# Patient Record
Sex: Male | Born: 1950 | Race: White | Hispanic: No | Marital: Married | Smoking: Former smoker
Health system: Southern US, Community
[De-identification: ages and names within clinical notes are randomized; demographics above are authoritative.]

## PROBLEM LIST (undated history)

## (undated) DIAGNOSIS — Z8719 Personal history of other diseases of the digestive system: Secondary | ICD-10-CM

## (undated) DIAGNOSIS — E785 Hyperlipidemia, unspecified: Secondary | ICD-10-CM

## (undated) DIAGNOSIS — K219 Gastro-esophageal reflux disease without esophagitis: Secondary | ICD-10-CM

## (undated) DIAGNOSIS — D1803 Hemangioma of intra-abdominal structures: Secondary | ICD-10-CM

## (undated) DIAGNOSIS — N200 Calculus of kidney: Secondary | ICD-10-CM

## (undated) DIAGNOSIS — N189 Chronic kidney disease, unspecified: Secondary | ICD-10-CM

## (undated) DIAGNOSIS — K2270 Barrett's esophagus without dysplasia: Secondary | ICD-10-CM

## (undated) HISTORY — PX: KNEE ARTHROSCOPY: SUR90

## (undated) HISTORY — PX: ESOPHAGOGASTRODUODENOSCOPY: SHX1529

## (undated) HISTORY — PX: COLONOSCOPY: SHX174

## (undated) HISTORY — PX: FRACTURE SURGERY: SHX138

## (undated) HISTORY — PX: NOSE SURGERY: SHX723

---

## 2004-08-01 ENCOUNTER — Ambulatory Visit: Payer: Self-pay | Admitting: Surgery

## 2004-08-10 ENCOUNTER — Emergency Department: Payer: Self-pay | Admitting: Emergency Medicine

## 2004-11-19 ENCOUNTER — Ambulatory Visit: Payer: Self-pay | Admitting: Urology

## 2004-12-26 ENCOUNTER — Emergency Department: Payer: Self-pay | Admitting: Internal Medicine

## 2004-12-26 ENCOUNTER — Other Ambulatory Visit: Payer: Self-pay

## 2007-10-17 ENCOUNTER — Ambulatory Visit: Payer: Self-pay | Admitting: Family Medicine

## 2007-10-20 ENCOUNTER — Ambulatory Visit: Payer: Self-pay | Admitting: Family Medicine

## 2008-01-09 ENCOUNTER — Ambulatory Visit: Payer: Self-pay | Admitting: Gastroenterology

## 2008-01-18 ENCOUNTER — Ambulatory Visit: Payer: Self-pay | Admitting: Family Medicine

## 2008-02-29 ENCOUNTER — Ambulatory Visit: Payer: Self-pay | Admitting: Gastroenterology

## 2008-04-12 ENCOUNTER — Ambulatory Visit: Payer: Self-pay | Admitting: Gastroenterology

## 2008-08-03 ENCOUNTER — Ambulatory Visit: Payer: Self-pay | Admitting: Urology

## 2009-02-05 ENCOUNTER — Ambulatory Visit: Payer: Self-pay | Admitting: Urology

## 2009-03-02 ENCOUNTER — Ambulatory Visit: Payer: Self-pay | Admitting: Family Medicine

## 2009-07-05 ENCOUNTER — Ambulatory Visit: Payer: Self-pay | Admitting: Gastroenterology

## 2011-05-19 ENCOUNTER — Emergency Department: Payer: Self-pay | Admitting: Emergency Medicine

## 2011-05-19 LAB — BASIC METABOLIC PANEL
BUN: 10 mg/dL (ref 7–18)
Chloride: 104 mmol/L (ref 98–107)
Co2: 29 mmol/L (ref 21–32)
Creatinine: 1.24 mg/dL (ref 0.60–1.30)
EGFR (Non-African Amer.): >60
Glucose: 105 mg/dL — ABNORMAL HIGH (ref 65–99)
Osmolality: 281 (ref 275–301)

## 2011-05-19 LAB — URINALYSIS, COMPLETE
Bilirubin,UR: NEGATIVE
Glucose,UR: NEGATIVE mg/dL (ref 0–75)
Ketone: NEGATIVE
Leukocyte Esterase: NEGATIVE
Ph: 6 (ref 4.5–8.0)
Protein: NEGATIVE
Specific Gravity: 1.009 (ref 1.003–1.030)
WBC UR: 1 /HPF (ref 0–5)

## 2011-05-19 LAB — CBC
HCT: 42 % (ref 40.0–52.0)
MCH: 31.4 pg (ref 26.0–34.0)
MCV: 93 fL (ref 80–100)
Platelet: 219 10*3/uL (ref 150–440)
RBC: 4.52 10*6/uL (ref 4.40–5.90)
RDW: 13.4 % (ref 11.5–14.5)

## 2011-08-24 ENCOUNTER — Ambulatory Visit: Payer: Self-pay | Admitting: Gastroenterology

## 2011-08-25 LAB — PATHOLOGY REPORT

## 2011-10-01 ENCOUNTER — Ambulatory Visit: Payer: Self-pay | Admitting: Gastroenterology

## 2012-12-19 ENCOUNTER — Ambulatory Visit: Payer: Self-pay | Admitting: Gastroenterology

## 2013-01-09 ENCOUNTER — Ambulatory Visit: Payer: Self-pay | Admitting: Gastroenterology

## 2013-01-10 LAB — PATHOLOGY REPORT

## 2014-07-18 ENCOUNTER — Other Ambulatory Visit: Payer: Self-pay | Admitting: Orthopedic Surgery

## 2014-07-18 DIAGNOSIS — M2392 Unspecified internal derangement of left knee: Secondary | ICD-10-CM

## 2014-07-26 ENCOUNTER — Ambulatory Visit
Admission: RE | Admit: 2014-07-26 | Discharge: 2014-07-26 | Disposition: A | Discharge status: Home / Self Care | Payer: Commercial Managed Care - HMO | Source: Ambulatory Visit | Attending: Orthopedic Surgery | Admitting: Orthopedic Surgery

## 2014-07-26 DIAGNOSIS — S83242A Other tear of medial meniscus, current injury, left knee, initial encounter: Secondary | ICD-10-CM | POA: Diagnosis not present

## 2014-07-26 DIAGNOSIS — M2392 Unspecified internal derangement of left knee: Secondary | ICD-10-CM

## 2014-07-26 DIAGNOSIS — S83412A Sprain of medial collateral ligament of left knee, initial encounter: Secondary | ICD-10-CM | POA: Insufficient documentation

## 2014-07-26 DIAGNOSIS — M25561 Pain in right knee: Secondary | ICD-10-CM | POA: Diagnosis present

## 2014-07-26 DIAGNOSIS — M1712 Unilateral primary osteoarthritis, left knee: Secondary | ICD-10-CM | POA: Diagnosis not present

## 2014-08-03 ENCOUNTER — Other Ambulatory Visit: Payer: 59

## 2014-08-03 ENCOUNTER — Encounter: Payer: Self-pay | Admitting: *Deleted

## 2014-08-03 DIAGNOSIS — Z8249 Family history of ischemic heart disease and other diseases of the circulatory system: Secondary | ICD-10-CM | POA: Diagnosis not present

## 2014-08-03 DIAGNOSIS — Z806 Family history of leukemia: Secondary | ICD-10-CM | POA: Diagnosis not present

## 2014-08-03 DIAGNOSIS — M6752 Plica syndrome, left knee: Secondary | ICD-10-CM | POA: Diagnosis present

## 2014-08-03 DIAGNOSIS — Z803 Family history of malignant neoplasm of breast: Secondary | ICD-10-CM | POA: Diagnosis not present

## 2014-08-03 DIAGNOSIS — Z79899 Other long term (current) drug therapy: Secondary | ICD-10-CM | POA: Diagnosis not present

## 2014-08-03 DIAGNOSIS — Z833 Family history of diabetes mellitus: Secondary | ICD-10-CM | POA: Diagnosis not present

## 2014-08-03 DIAGNOSIS — E785 Hyperlipidemia, unspecified: Secondary | ICD-10-CM | POA: Diagnosis not present

## 2014-08-03 DIAGNOSIS — K2270 Barrett's esophagus without dysplasia: Secondary | ICD-10-CM | POA: Diagnosis not present

## 2014-08-03 DIAGNOSIS — D1809 Hemangioma of other sites: Secondary | ICD-10-CM | POA: Diagnosis not present

## 2014-08-03 DIAGNOSIS — K219 Gastro-esophageal reflux disease without esophagitis: Secondary | ICD-10-CM | POA: Diagnosis not present

## 2014-08-03 DIAGNOSIS — M23222 Derangement of posterior horn of medial meniscus due to old tear or injury, left knee: Secondary | ICD-10-CM | POA: Diagnosis present

## 2014-08-03 DIAGNOSIS — Z87891 Personal history of nicotine dependence: Secondary | ICD-10-CM | POA: Diagnosis not present

## 2014-08-03 NOTE — Patient Instructions (Signed)
  Your procedure is scheduled on: 08-07-14 Report to [place]. To find out your arrival time please call [phone] between 1PM - 3PM on 08-06-14  Remember: Instructions that are not followed completely may result in serious medical risk, up to and including death, or upon the discretion of your surgeon and anesthesiologist your surgery may need to be rescheduled.    _X___ 1. Do not eat food or drink liquids after midnight. No gum chewing or hard candies.     _X___ 2. No Alcohol for 24 hours before or after surgery.   ____ 3. Bring all medications with you on the day of surgery if instructed.    ____ 4. Notify your doctor if there is any change in your medical condition     (cold, fever, infections).     Do not wear jewelry, make-up, hairpins, clips or nail polish.  Do not wear lotions, powders, or perfumes. You may wear deodorant.  Do not shave 48 hours prior to surgery. Men may shave face and neck.  Do not bring valuables to the hospital.    [facility] is not responsible for any belongings or valuables.               Contacts, dentures or bridgework may not be worn into surgery.  Leave your suitcase in the car. After surgery it may be brought to your room.  For patients admitted to the hospital, discharge time is determined by your  treatment team.   Patients discharged the day of surgery will not be allowed to drive home.   Please read over the following fact sheets that you were given:     _X___ Take these medicines the morning of surgery with A SIP OF WATER:    1. PROTONIX  2. TAKE AN EXTRA PROTONIX ON Monday NIGHT  3.   4.  5.  6.  ____ Fleet Enema (as directed)   ____ Use CHG Soap as directed  ____ Use inhalers on the day of surgery  ____ Stop metformin 2 days prior to surgery    ____ Take 1/2 of usual insulin dose the night before surgery and none on the morning of surgery.   ____ Stop Coumadin/Plavix/aspirin-N/A  ____ Stop  Anti-inflammatories-NO NSAIDS OR ASA PRODUCTS-TYLENOL OK   _X___ Stop supplements until after surgery-STOP FISH OIL NOW  ____ Bring C-Pap to the hospital.

## 2014-08-07 ENCOUNTER — Ambulatory Visit: Payer: Commercial Managed Care - HMO | Admitting: Certified Registered"

## 2014-08-07 ENCOUNTER — Ambulatory Visit
Admission: RE | Admit: 2014-08-07 | Discharge: 2014-08-07 | Disposition: A | Discharge status: Home / Self Care | Payer: Commercial Managed Care - HMO | Source: Ambulatory Visit | Attending: Orthopedic Surgery | Admitting: Orthopedic Surgery

## 2014-08-07 ENCOUNTER — Encounter
Admission: RE | Disposition: A | Discharge status: Home / Self Care | Payer: Self-pay | Source: Ambulatory Visit | Attending: Orthopedic Surgery

## 2014-08-07 ENCOUNTER — Encounter: Payer: Self-pay | Admitting: *Deleted

## 2014-08-07 DIAGNOSIS — Z833 Family history of diabetes mellitus: Secondary | ICD-10-CM | POA: Insufficient documentation

## 2014-08-07 DIAGNOSIS — D1809 Hemangioma of other sites: Secondary | ICD-10-CM | POA: Insufficient documentation

## 2014-08-07 DIAGNOSIS — Z803 Family history of malignant neoplasm of breast: Secondary | ICD-10-CM | POA: Insufficient documentation

## 2014-08-07 DIAGNOSIS — Z8249 Family history of ischemic heart disease and other diseases of the circulatory system: Secondary | ICD-10-CM | POA: Insufficient documentation

## 2014-08-07 DIAGNOSIS — E785 Hyperlipidemia, unspecified: Secondary | ICD-10-CM | POA: Insufficient documentation

## 2014-08-07 DIAGNOSIS — Z806 Family history of leukemia: Secondary | ICD-10-CM | POA: Insufficient documentation

## 2014-08-07 DIAGNOSIS — Z79899 Other long term (current) drug therapy: Secondary | ICD-10-CM | POA: Insufficient documentation

## 2014-08-07 DIAGNOSIS — K2270 Barrett's esophagus without dysplasia: Secondary | ICD-10-CM | POA: Insufficient documentation

## 2014-08-07 DIAGNOSIS — Z87891 Personal history of nicotine dependence: Secondary | ICD-10-CM | POA: Diagnosis not present

## 2014-08-07 DIAGNOSIS — K219 Gastro-esophageal reflux disease without esophagitis: Secondary | ICD-10-CM | POA: Insufficient documentation

## 2014-08-07 HISTORY — PX: KNEE ARTHROSCOPY WITH MEDIAL MENISECTOMY: SHX5651

## 2014-08-07 HISTORY — DX: Chronic kidney disease, unspecified: N18.9

## 2014-08-07 HISTORY — DX: Hemangioma of intra-abdominal structures: D18.03

## 2014-08-07 HISTORY — DX: Hyperlipidemia, unspecified: E78.5

## 2014-08-07 HISTORY — DX: Barrett's esophagus without dysplasia: K22.70

## 2014-08-07 HISTORY — DX: Gastro-esophageal reflux disease without esophagitis: K21.9

## 2014-08-07 HISTORY — PX: KNEE ARTHROSCOPY: SUR90

## 2014-08-07 SURGERY — ARTHROSCOPY, KNEE, WITH MEDIAL MENISCECTOMY
Anesthesia: General | Site: Knee | Laterality: Left | Wound class: Clean

## 2014-08-07 MED ORDER — HYDROCODONE-ACETAMINOPHEN 5-325 MG PO TABS: 1.0000 | ORAL_TABLET | Freq: Four times a day (QID) | ORAL | Status: DC | PRN

## 2014-08-07 MED ORDER — LIDOCAINE HCL (CARDIAC) 20 MG/ML IV SOLN
INTRAVENOUS | Status: DC | PRN
  Administered 2014-08-07: 50 mg via INTRAVENOUS

## 2014-08-07 MED ORDER — FENTANYL CITRATE (PF) 100 MCG/2ML IJ SOLN
INTRAMUSCULAR | Status: DC | PRN
  Administered 2014-08-07 (×2): 50 ug via INTRAVENOUS

## 2014-08-07 MED ORDER — MIDAZOLAM HCL 2 MG/2ML IJ SOLN
INTRAMUSCULAR | Status: DC | PRN
  Administered 2014-08-07: 2 mg via INTRAVENOUS

## 2014-08-07 MED ORDER — LACTATED RINGERS IV SOLN
INTRAVENOUS | Status: DC
  Administered 2014-08-07 (×2): via INTRAVENOUS

## 2014-08-07 MED ORDER — GLYCOPYRROLATE 0.2 MG/ML IJ SOLN
INTRAMUSCULAR | Status: DC | PRN
  Administered 2014-08-07: 0.2 mg via INTRAVENOUS

## 2014-08-07 MED ORDER — FENTANYL CITRATE (PF) 100 MCG/2ML IJ SOLN: 25.0000 ug | INTRAMUSCULAR | Status: DC | PRN

## 2014-08-07 MED ORDER — BUPIVACAINE-EPINEPHRINE (PF) 0.5% -1:200000 IJ SOLN
INTRAMUSCULAR | Status: AC
  Filled 2014-08-07: qty 30

## 2014-08-07 MED ORDER — BUPIVACAINE-EPINEPHRINE (PF) 0.5% -1:200000 IJ SOLN
INTRAMUSCULAR | Status: DC | PRN
  Administered 2014-08-07: 20 mL via PERINEURAL

## 2014-08-07 MED ORDER — ONDANSETRON HCL 4 MG/2ML IJ SOLN: 4.0000 mg | Freq: Once | INTRAMUSCULAR | Status: DC | PRN

## 2014-08-07 MED ORDER — ONDANSETRON HCL 4 MG/2ML IJ SOLN
INTRAMUSCULAR | Status: DC | PRN
  Administered 2014-08-07: 4 mg via INTRAVENOUS

## 2014-08-07 MED ORDER — KETOROLAC TROMETHAMINE 30 MG/ML IJ SOLN
INTRAMUSCULAR | Status: DC | PRN
  Administered 2014-08-07: 30 mg via INTRAVENOUS

## 2014-08-07 MED ORDER — PROPOFOL 10 MG/ML IV BOLUS
INTRAVENOUS | Status: DC | PRN
  Administered 2014-08-07: 150 mg via INTRAVENOUS

## 2014-08-07 SURGICAL SUPPLY — 27 items
BANDAGE ELASTIC 4 CLIP NS LF (GAUZE/BANDAGES/DRESSINGS) ×3 IMPLANT
BANDAGE ELASTIC 4 CLIP ST LF (GAUZE/BANDAGES/DRESSINGS) ×3 IMPLANT
BLADE FULL RADIUS 3.5 (BLADE) IMPLANT
BLADE INCISOR PLUS 4.5 (BLADE) IMPLANT
BLADE SHAVER 4.5 DBL SERAT CV (CUTTER) ×3 IMPLANT
BLADE SHAVER 4.5X7 STR FR (MISCELLANEOUS) ×3 IMPLANT
CHLORAPREP W/TINT 26ML (MISCELLANEOUS) ×3 IMPLANT
GAUZE PETRO XEROFOAM 1X8 (MISCELLANEOUS) ×3 IMPLANT
GAUZE SPONGE 4X4 12PLY STRL (GAUZE/BANDAGES/DRESSINGS) ×3 IMPLANT
GLOVE BIOGEL PI IND STRL 9 (GLOVE) ×1 IMPLANT
GLOVE BIOGEL PI INDICATOR 9 (GLOVE) ×2
GLOVE SURG ORTHO 9.0 STRL STRW (GLOVE) ×3 IMPLANT
GOWN SPECIALTY ULTRA XL (MISCELLANEOUS) ×3 IMPLANT
GOWN STRL REUS W/ TWL LRG LVL3 (GOWN DISPOSABLE) ×1 IMPLANT
GOWN STRL REUS W/TWL LRG LVL3 (GOWN DISPOSABLE) ×2
IV LACTATED RINGER IRRG 3000ML (IV SOLUTION) ×8
IV LR IRRIG 3000ML ARTHROMATIC (IV SOLUTION) ×4 IMPLANT
KIT RM TURNOVER STRD PROC AR (KITS) ×3 IMPLANT
MANIFOLD NEPTUNE II (INSTRUMENTS) ×3 IMPLANT
PACK ARTHROSCOPY KNEE (MISCELLANEOUS) ×3 IMPLANT
SET TUBE SUCT SHAVER OUTFL 24K (TUBING) ×3 IMPLANT
SET TUBE TIP INTRA-ARTICULAR (MISCELLANEOUS) ×3 IMPLANT
SUT ETHILON 4-0 (SUTURE) ×2
SUT ETHILON 4-0 FS2 18XMFL BLK (SUTURE) ×1
SUTURE ETHLN 4-0 FS2 18XMF BLK (SUTURE) ×1 IMPLANT
TUBING ARTHRO INFLOW-ONLY STRL (TUBING) ×3 IMPLANT
WAND HAND CNTRL MULTIVAC 50 (MISCELLANEOUS) ×3 IMPLANT

## 2014-08-07 NOTE — Progress Notes (Signed)
Elevated leg on pillows  Dr Rudene Christians to check pulse to left foot  Weak and marked with pen mark

## 2014-08-07 NOTE — Anesthesia Procedure Notes (Signed)
Procedure Name: LMA Insertion Date/Time: 08/07/2014 12:46 PM Performed by: Rolla Plate Pre-anesthesia Checklist: Patient identified, Patient being monitored, Timeout performed, Emergency Drugs available and Suction available Patient Re-evaluated:Patient Re-evaluated prior to inductionOxygen Delivery Method: Circle system utilized Preoxygenation: Pre-oxygenation with 100% oxygen Intubation Type: IV induction LMA: LMA inserted LMA Size: 4.0 Number of attempts: 1 Placement Confirmation: positive ETCO2 and breath sounds checked- equal and bilateral Tube secured with: Tape Dental Injury: Teeth and Oropharynx as per pre-operative assessment

## 2014-08-07 NOTE — Discharge Instructions (Signed)
Weightbearing as tolerated on left leg, one aspirin a day until walking normal. If bandage slides down leg coverage to incisions with Band-Aid and rewrap Ace wrap discarded remaining bandage

## 2014-08-07 NOTE — Anesthesia Postprocedure Evaluation (Signed)
  Anesthesia Post-op Note  Patient: Maurice Lucas  Procedure(s) Performed: Procedure(s) with comments: KNEE ARTHROSCOPY WITH MEDIAL MENISECTOMY (Left) - partial medial menisectomy, plica incsion  Anesthesia type:General  Patient location: PACU  Post pain: Pain level controlled  Post assessment: Post-op Vital signs reviewed, Patient's Cardiovascular Status Stable, Respiratory Function Stable, Patent Airway and No signs of Nausea or vomiting  Post vital signs: Reviewed and stable  Last Vitals:  Filed Vitals:   08/07/14 1449  BP: 118/78  Pulse:   Temp: 36.7 C  Resp: 16    Level of consciousness: awake, alert  and patient cooperative  Complications: No apparent anesthesia complications. Chest clear. Saturation 96-98. Will d/c.

## 2014-08-07 NOTE — Transfer of Care (Signed)
Immediate Anesthesia Transfer of Care Note  Patient: Maurice Lucas  Procedure(s) Performed: Procedure(s) with comments: KNEE ARTHROSCOPY WITH MEDIAL MENISECTOMY (Left) - partial medial menisectomy, plica incsion  Patient Location: PACU  Anesthesia Type:General  Level of Consciousness: sedated  Airway & Oxygen Therapy: Patient Spontanous Breathing and Patient connected to face mask oxygen  Post-op Assessment: Report given to RN  Post vital signs: Reviewed  Last Vitals:  Filed Vitals:   08/07/14 1347  BP: 128/89  Pulse: 66  Temp: 36.9 C  Resp: 19    Complications: No apparent anesthesia complications

## 2014-08-07 NOTE — H&P (Signed)
Reviewed paper H+P, will be scanned into chart. No changes noted.  

## 2014-08-07 NOTE — Op Note (Signed)
08/07/2014   1:43 PM  PATIENT:  Maurice Lucas  64 y.o. male  PRE-OPERATIVE DIAGNOSIS:  COMPLETE TEAR MEDIAL MENISCUS LEFT  POST-OPERATIVE DIAGNOSIS:  COMPLETE TEAR MEDIAL MENISCUS LEFT  PROCEDURE:  Procedure(s) with comments: KNEE ARTHROSCOPY WITH MEDIAL MENISECTOMY (Left) - partial medial menisectomy, plica incsion  SURGEON: Laurene Footman, MD  ASSISTANTS: none  ANESTHESIA:   spinal  EBL:  Total I/O In: 500 [I.V.:500] Out: -   BLOOD ADMINISTERED:none  DRAINS: none   LOCAL MEDICATIONS USED:  MARCAINE     SPECIMEN:  No Specimen  DISPOSITION OF SPECIMEN:  N/A  COUNTS:  YES  TOURNIQUET:    IMPLANTS: None  DICTATION: .Dragon Dictation patient was brought to the operating room and after general anesthesia was obtained the left leg was prepped and draped in usual sterile fashion. After appropriate patient education timeout procedure were completed, inferior lateral portal was made and the arthroscope was introduced initial inspection revealed moderate patellofemoral degenerative changes with fissuring of the articular cartilage of the patella and the central portion of the trochlea the patellas in the midline. There is a medial plica which was socially ablated with use of an ArthroCare wand.: Medial compartment inferior medial portal was made and on probing the anterior posterior third of the meniscus appeared to be torn in a complex pattern there was significant degenerative changes to the medial compartment as well. Without exposed bone the anterior cruciate ligament was intact and lateral compartment was normal. The meniscal punch was used to debride most of the meniscus tear followed by an ArthroCare wand to smooth the edges. The probing the meniscus was stable there is also a small tear of the anterior meniscus which was ablated with the ArthroCare wand this point the plica band was excised with the ArthroCare wand. The knee was irrigated until clear. Wound was infiltrated with  half percent Sensorcaine with epinephrine 20 cc Xeroform 4 x 4's web roll and Ace wrap applied  PLAN OF CARE: Discharge to home after PACU  PATIENT DISPOSITION:  PACU - hemodynamically stable.

## 2014-08-07 NOTE — Anesthesia Preprocedure Evaluation (Signed)
Anesthesia Evaluation  Patient identified by MRN, date of birth, ID band Patient awake    Reviewed: Allergy & Precautions, NPO status , Patient's Chart, lab work & pertinent test results  Airway Mallampati: II       Dental  (+) Upper Dentures, Lower Dentures   Pulmonary former smoker,          Cardiovascular     Neuro/Psych    GI/Hepatic   Endo/Other    Renal/GU Renal InsufficiencyRenal disease     Musculoskeletal   Abdominal   Peds  Hematology   Anesthesia Other Findings   Reproductive/Obstetrics                             Anesthesia Physical Anesthesia Plan  ASA: II  Anesthesia Plan: General   Post-op Pain Management:    Induction: Intravenous  Airway Management Planned: LMA  Additional Equipment:   Intra-op Plan:   Post-operative Plan:   Informed Consent: I have reviewed the patients History and Physical, chart, labs and discussed the procedure including the risks, benefits and alternatives for the proposed anesthesia with the patient or authorized representative who has indicated his/her understanding and acceptance.     Plan Discussed with: CRNA  Anesthesia Plan Comments:         Anesthesia Quick Evaluation

## 2014-10-04 ENCOUNTER — Other Ambulatory Visit: Payer: Self-pay | Admitting: Gastroenterology

## 2014-10-04 DIAGNOSIS — R1310 Dysphagia, unspecified: Secondary | ICD-10-CM

## 2014-10-08 ENCOUNTER — Ambulatory Visit
Admission: RE | Admit: 2014-10-08 | Discharge: 2014-10-08 | Disposition: A | Discharge status: Home / Self Care | Payer: 59 | Source: Ambulatory Visit | Attending: Gastroenterology | Admitting: Gastroenterology

## 2014-10-08 DIAGNOSIS — R1314 Dysphagia, pharyngoesophageal phase: Secondary | ICD-10-CM | POA: Diagnosis present

## 2014-10-08 DIAGNOSIS — R1310 Dysphagia, unspecified: Secondary | ICD-10-CM

## 2014-10-08 DIAGNOSIS — K222 Esophageal obstruction: Secondary | ICD-10-CM | POA: Insufficient documentation

## 2014-11-01 ENCOUNTER — Encounter: Payer: Self-pay | Admitting: *Deleted

## 2014-11-01 DIAGNOSIS — K222 Esophageal obstruction: Secondary | ICD-10-CM | POA: Diagnosis not present

## 2014-11-01 DIAGNOSIS — Z87891 Personal history of nicotine dependence: Secondary | ICD-10-CM | POA: Diagnosis not present

## 2014-11-01 DIAGNOSIS — Z87442 Personal history of urinary calculi: Secondary | ICD-10-CM | POA: Diagnosis not present

## 2014-11-01 DIAGNOSIS — K317 Polyp of stomach and duodenum: Secondary | ICD-10-CM | POA: Diagnosis not present

## 2014-11-01 DIAGNOSIS — N189 Chronic kidney disease, unspecified: Secondary | ICD-10-CM | POA: Diagnosis not present

## 2014-11-01 DIAGNOSIS — K2970 Gastritis, unspecified, without bleeding: Secondary | ICD-10-CM | POA: Diagnosis not present

## 2014-11-01 DIAGNOSIS — K219 Gastro-esophageal reflux disease without esophagitis: Secondary | ICD-10-CM | POA: Diagnosis not present

## 2014-11-01 DIAGNOSIS — E785 Hyperlipidemia, unspecified: Secondary | ICD-10-CM | POA: Diagnosis not present

## 2014-11-01 DIAGNOSIS — R1310 Dysphagia, unspecified: Secondary | ICD-10-CM | POA: Diagnosis present

## 2014-11-02 ENCOUNTER — Encounter: Payer: Self-pay | Admitting: Anesthesiology

## 2014-11-02 ENCOUNTER — Ambulatory Visit: Payer: Commercial Managed Care - HMO | Admitting: Anesthesiology

## 2014-11-02 ENCOUNTER — Ambulatory Visit
Admission: RE | Admit: 2014-11-02 | Discharge: 2014-11-02 | Disposition: A | Discharge status: Home / Self Care | Payer: Commercial Managed Care - HMO | Source: Ambulatory Visit | Attending: Gastroenterology | Admitting: Gastroenterology

## 2014-11-02 ENCOUNTER — Encounter
Admission: RE | Disposition: A | Discharge status: Home / Self Care | Payer: Self-pay | Source: Ambulatory Visit | Attending: Gastroenterology

## 2014-11-02 DIAGNOSIS — R1310 Dysphagia, unspecified: Secondary | ICD-10-CM | POA: Insufficient documentation

## 2014-11-02 DIAGNOSIS — Z87442 Personal history of urinary calculi: Secondary | ICD-10-CM | POA: Insufficient documentation

## 2014-11-02 DIAGNOSIS — K222 Esophageal obstruction: Secondary | ICD-10-CM | POA: Insufficient documentation

## 2014-11-02 DIAGNOSIS — K2970 Gastritis, unspecified, without bleeding: Secondary | ICD-10-CM | POA: Insufficient documentation

## 2014-11-02 DIAGNOSIS — K219 Gastro-esophageal reflux disease without esophagitis: Secondary | ICD-10-CM | POA: Insufficient documentation

## 2014-11-02 DIAGNOSIS — Z87891 Personal history of nicotine dependence: Secondary | ICD-10-CM | POA: Insufficient documentation

## 2014-11-02 DIAGNOSIS — N189 Chronic kidney disease, unspecified: Secondary | ICD-10-CM | POA: Insufficient documentation

## 2014-11-02 DIAGNOSIS — E785 Hyperlipidemia, unspecified: Secondary | ICD-10-CM | POA: Insufficient documentation

## 2014-11-02 DIAGNOSIS — K317 Polyp of stomach and duodenum: Secondary | ICD-10-CM | POA: Insufficient documentation

## 2014-11-02 HISTORY — PX: ESOPHAGOGASTRODUODENOSCOPY (EGD) WITH PROPOFOL: SHX5813

## 2014-11-02 HISTORY — DX: Calculus of kidney: N20.0

## 2014-11-02 HISTORY — DX: Personal history of other diseases of the digestive system: Z87.19

## 2014-11-02 SURGERY — ESOPHAGOGASTRODUODENOSCOPY (EGD) WITH PROPOFOL
Anesthesia: General

## 2014-11-02 MED ORDER — PROPOFOL 10 MG/ML IV BOLUS
INTRAVENOUS | Status: DC | PRN
  Administered 2014-11-02: 80 mg via INTRAVENOUS

## 2014-11-02 MED ORDER — PROPOFOL INFUSION 10 MG/ML OPTIME
INTRAVENOUS | Status: DC | PRN
  Administered 2014-11-02: 150 ug/kg/min via INTRAVENOUS

## 2014-11-02 MED ORDER — PHENYLEPHRINE HCL 10 MG/ML IJ SOLN
INTRAMUSCULAR | Status: DC | PRN
  Administered 2014-11-02 (×2): 100 ug via INTRAVENOUS

## 2014-11-02 MED ORDER — LIDOCAINE HCL (CARDIAC) 20 MG/ML IV SOLN
INTRAVENOUS | Status: DC | PRN
  Administered 2014-11-02: 100 mg via INTRAVENOUS

## 2014-11-02 MED ORDER — GLYCOPYRROLATE 0.2 MG/ML IJ SOLN
INTRAMUSCULAR | Status: DC | PRN
  Administered 2014-11-02: 0.2 mg via INTRAVENOUS

## 2014-11-02 MED ORDER — SODIUM CHLORIDE 0.9 % IV SOLN
INTRAVENOUS | Status: DC
  Administered 2014-11-02: 1000 mL via INTRAVENOUS

## 2014-11-02 NOTE — H&P (Signed)
Outpatient short stay form Pre-procedure 11/02/2014 8:13 AM Lollie Sails MD  Primary Physician: Dr. Thereasa Distance  Reason for visit:  Follow-up Barrett's esophagus and dysphagia  History of present illness:  Patient is a 64 year old male presenting for follow-up in regards to Barrett's esophagus. It also been experiencing some dysphagia that he related to taking aspirin. Since he stopped the aspirin is dysphagia better. He does take pantoprazole 40 mg daily. He has 2 difficulties with his swallowing 1 is in the cervical region as being probably more prominent also affected with water as well as some in the lower chest area. This does not seem to be as bad as it was.    Current facility-administered medications:  .  0.9 %  sodium chloride infusion, , Intravenous, Continuous, Lollie Sails, MD, Last Rate: 20 mL/hr at 11/02/14 0728, 1,000 mL at 11/02/14 6389  Facility-Administered Medications Ordered in Other Encounters:  .  glycopyrrolate (ROBINUL) injection, , , Anesthesia Intra-op, Andria Frames, MD, 0.2 mg at 11/02/14 3734  Prescriptions prior to admission  Medication Sig Dispense Refill Last Dose  . HYDROcodone-acetaminophen (NORCO) 5-325 MG per tablet Take 1-2 tablets by mouth every 6 (six) hours as needed for moderate pain. 40 tablet 0 11/01/2014 at Unknown time  . Omega-3 Fatty Acids (FISH OIL CONCENTRATE PO) Take 1 capsule by mouth daily.   11/01/2014 at Unknown time  . pantoprazole (PROTONIX) 40 MG tablet Take 40 mg by mouth every morning.   Past Week at Unknown time  . simvastatin (ZOCOR) 20 MG tablet Take 20 mg by mouth at bedtime.   11/01/2014 at Unknown time     No Known Allergies   Past Medical History  Diagnosis Date  . Chronic kidney disease     kidney stones  . Hyperlipidemia   . Hepatic hemangioma   . GERD (gastroesophageal reflux disease)   . Barrett esophagus   . Kidney stone     Review of systems:      Physical Exam    Heart and lungs: Regular  rate and rhythm without rub or gallop, lungs are bilaterally clear    HEENT: Normocephalic atraumatic eyes are anicteric    Other:     Pertinant exam for procedure: Soft nontender nondistended bowel sounds positive normoactive    Planned proceedures: EGD and indicated procedures I have discussed the risks benefits and complications of procedures to include not limited to bleeding, infection, perforation and the risk of sedation and the patient wishes to proceed.    Lollie Sails, MD Gastroenterology 11/02/2014  8:13 AM

## 2014-11-02 NOTE — Op Note (Signed)
[facility] Gastroenterology Patient Name: Maurice Lucas Procedure Date: 11/02/2014 8:06 AM MRN: [id number] Account #: [id number] Date of Birth: [birth year] Admit Type: Outpatient Age: 64 Room: [facility] ENDO ROOM 1 Gender: Male Note Status: Finalized Procedure:         Upper GI endoscopy Indications:       Dysphagia, Follow-up of Barrett's esophagus Providers:         Lollie Sails, MD Referring MD:      Sofie Hartigan (Referring MD) Medicines:         Monitored Anesthesia Care Complications:     No immediate complications. Procedure:         Pre-Anesthesia Assessment:                    - ASA Grade Assessment: II - A patient with mild systemic                     disease.                    After obtaining informed consent, the endoscope was passed                     under direct vision. Throughout the procedure, the                     patient's blood pressure, pulse, and oxygen saturations                     were monitored continuously. The Olympus GIF-160 endoscope                     (S#. [id number]) was introduced through the mouth, and                     advanced to the third part of duodenum. The upper GI                     endoscopy was accomplished without difficulty. The patient                     tolerated the procedure well. Findings:      A low-grade of narrowing Schatzki ring (acquired) was found at the       gastroesophageal junction, I was able to pass the scope with careful       manipulattion, but the junction had several rings above the GEJ about       1-1.5 cm and a narrowed area at the GE junction itself.      Two 1 mm sessile polyps with no bleeding and stigmata of recent bleeding       were found on the anterior wall of the gastric body. These polyps were       removed with a cold biopsy forceps. Resection and retrieval were       complete.      The cardia and gastric fundus were normal on retroflexion.      Patchy minimal inflammation  characterized by erythema was found in the       gastric body. Biopsies were taken with a cold forceps for histology.       Biopsies were taken with a cold forceps for Helicobacter pylori testing.      A low-grade of narrowing Schatzki ring (acquired) was found in the lower  third of the esophagus and at the gastroesophageal junction. A TTS       dilator was passed through the scope. Dilation with a 12-04-10 mm       balloon (to a maximum balloon size of 12 mm) dilator was performed       successfully.      There were esophageal mucosal changes consistent with short-segment       Barrett's esophagus present at the gastroesophageal junction. The       maximum longitudinal extent of these mucosal changes was 1 cm in length.       Mucosa was biopsied with a cold forceps for histology in a quadrant       fashion.      A small hiatus hernia was present.      Patchy moderate inflammation characterized by congestion (edema) and       erythema was found in the duodenal bulb. Impression:        - Low-grade of narrowing Schatzki ring.                    - Two gastric polyps. Resected and retrieved.                    - Gastritis. Biopsied.                    - Normal examined duodenum.                    - Low-grade of narrowing Schatzki ring. Dilated. Recommendation:    - Clear liquid diet today.                    - Soft diet for 4 days.                    - Return to GI clinic in 4 weeks. Procedure Code(s): --- Professional ---                    [phone], Esophagogastroduodenoscopy, flexible, transoral;                     with transendoscopic balloon dilation of esophagus (less                     than 30 mm diameter)                    43239, Esophagogastroduodenoscopy, flexible, transoral;                     with biopsy, single or multiple Diagnosis Code(s): --- Professional ---                    530.3, Stricture and stenosis of esophagus                    211.1, Benign neoplasm of  stomach                    535.50, Unspecified gastritis and gastroduodenitis,                     without mention of hemorrhage                    530.85, Barrett's esophagus  787.20, Dysphagia, unspecified CPT copyright 2014 [employer]. All rights reserved. The codes documented in this report are preliminary and upon coder review may  be revised to meet current compliance requirements. Lollie Sails, MD 11/02/2014 8:49:08 AM This report has been signed electronically. Number of Addenda: 0 Note Initiated On: 11/02/2014 8:06 AM      [facility]

## 2014-11-02 NOTE — Transfer of Care (Signed)
Immediate Anesthesia Transfer of Care Note  Patient: Maurice Lucas  Procedure(s) Performed: Procedure(s): ESOPHAGOGASTRODUODENOSCOPY (EGD) WITH PROPOFOL (N/A)  Patient Location: PACU and Endoscopy Unit  Anesthesia Type:General  Level of Consciousness: sedated  Airway & Oxygen Therapy: Patient Spontanous Breathing and Patient connected to nasal cannula oxygen  Post-op Assessment: Report given to RN and Post -op Vital signs reviewed and stable  Post vital signs: Reviewed and stable  Last Vitals:  Filed Vitals:   11/02/14 0714  BP: 121/78  Pulse: 57  Temp: 35.7 C  Resp: 16    Complications: No apparent anesthesia complications

## 2014-11-02 NOTE — Anesthesia Postprocedure Evaluation (Signed)
  Anesthesia Post-op Note  Patient: Maurice Lucas  Procedure(s) Performed: Procedure(s): ESOPHAGOGASTRODUODENOSCOPY (EGD) WITH PROPOFOL (N/A)  Anesthesia type:General  Patient location: PACU  Post pain: Pain level controlled  Post assessment: Post-op Vital signs reviewed, Patient's Cardiovascular Status Stable, Respiratory Function Stable, Patent Airway and No signs of Nausea or vomiting  Post vital signs: Reviewed and stable  Last Vitals:  Filed Vitals:   11/02/14 0910  BP: 103/74  Pulse: 57  Temp:   Resp: 22    Level of consciousness: awake, alert  and patient cooperative  Complications: No apparent anesthesia complications

## 2014-11-02 NOTE — Anesthesia Preprocedure Evaluation (Signed)
Anesthesia Evaluation  Patient identified by MRN, date of birth, ID band Patient awake    Reviewed: Allergy & Precautions, H&P , NPO status , Patient's Chart, lab work & pertinent test results  History of Anesthesia Complications (+) PROLONGED EMERGENCE and history of anesthetic complications  Airway Mallampati: III  TM Distance: >3 FB Neck ROM: limited    Dental  (+) Poor Dentition, Missing, Upper Dentures, Lower Dentures   Pulmonary neg shortness of breath, former smoker,    Pulmonary exam normal breath sounds clear to auscultation       Cardiovascular Exercise Tolerance: Good (-) Past MI negative cardio ROS Normal cardiovascular exam Rhythm:regular Rate:Normal     Neuro/Psych negative neurological ROS  negative psych ROS   GI/Hepatic Neg liver ROS, GERD  Controlled,  Endo/Other  negative endocrine ROS  Renal/GU CRFRenal disease  negative genitourinary   Musculoskeletal   Abdominal   Peds  Hematology negative hematology ROS (+)   Anesthesia Other Findings Past Medical History:   Chronic kidney disease                                         Comment:kidney stones   Hyperlipidemia                                               Hepatic hemangioma                                           GERD (gastroesophageal reflux disease)                       Barrett esophagus                                            Kidney stone                                                 Reproductive/Obstetrics negative OB ROS                             Anesthesia Physical Anesthesia Plan  ASA: III  Anesthesia Plan: General   Post-op Pain Management:    Induction:   Airway Management Planned:   Additional Equipment:   Intra-op Plan:   Post-operative Plan:   Informed Consent: I have reviewed the patients History and Physical, chart, labs and discussed the procedure including the risks,  benefits and alternatives for the proposed anesthesia with the patient or authorized representative who has indicated his/her understanding and acceptance.   Dental Advisory Given  Plan Discussed with: Anesthesiologist, CRNA and Surgeon  Anesthesia Plan Comments:         Anesthesia Quick Evaluation

## 2014-11-05 LAB — SURGICAL PATHOLOGY

## 2015-04-19 ENCOUNTER — Ambulatory Visit
Admission: EM | Admit: 2015-04-19 | Discharge: 2015-04-19 | Disposition: A | Discharge status: Home / Self Care | Payer: 59 | Attending: Family Medicine | Admitting: Family Medicine

## 2015-04-19 DIAGNOSIS — B349 Viral infection, unspecified: Secondary | ICD-10-CM

## 2015-04-19 LAB — RAPID INFLUENZA A&B ANTIGENS (ARMC ONLY)
INFLUENZA A (ARMC): NOT DETECTED
INFLUENZA B (ARMC): NOT DETECTED

## 2015-04-19 LAB — RAPID STREP SCREEN (MED CTR MEBANE ONLY): STREPTOCOCCUS, GROUP A SCREEN (DIRECT): NEGATIVE

## 2015-04-19 MED ORDER — GUAIFENESIN-CODEINE 100-10 MG/5ML PO SOLN: 10.0000 mL | Freq: Every evening | ORAL | Status: DC | PRN

## 2015-04-19 MED ORDER — OSELTAMIVIR PHOSPHATE 75 MG PO CAPS: 75.0000 mg | ORAL_CAPSULE | Freq: Two times a day (BID) | ORAL | Status: DC

## 2015-04-19 MED ORDER — ACETAMINOPHEN 500 MG PO TABS
1000.0000 mg | ORAL_TABLET | Freq: Once | ORAL | Status: AC
  Administered 2015-04-19: 1000 mg via ORAL

## 2015-04-19 NOTE — ED Notes (Signed)
Patient c/o cough, nasal congestion, chills, body aches, night sweats which started yesterday afternoon.  Denies n/v or chest pain.

## 2015-04-19 NOTE — ED Provider Notes (Signed)
[facility]  ____________________________________________  Time seen: Approximately 11:18 AM  I have reviewed the triage vital signs and the nursing notes.   HISTORY  Chief Complaint URI    HPI Maurice Lucas is a 65 y.o. male presents with complaints of 1 day of runny nose, nasal congestion, chills, body aches, fever and cough. Patient reports he felt fine yesterday morning the symptoms hit him suddenly in the afternoon. Reports continues to eat and drink well. Reports did take some Advil this morning. States he did not measure his fever but states that he felt like he had a fever. Reports has remained active. Reports no sick contacts at home but states some sick contacts at work. Denies pain at this time.  Denies chest pain, shortness of breath, abdominal pain, dysuria, dizziness, weakness.   PCP: Ellison Hughs  Past Medical History  Diagnosis Date  . Chronic kidney disease     kidney stones  . Hyperlipidemia   . Hepatic hemangioma   . GERD (gastroesophageal reflux disease)   . Barrett esophagus   . Kidney stone   . History of hiatal hernia     There are no active problems to display for this patient.   Past Surgical History  Procedure Laterality Date  . Nose surgery    . Knee arthroscopy Right   . Colonoscopy    . Esophagogastroduodenoscopy    . Knee arthroscopy Left 08/07/2014  . Knee arthroscopy with medial menisectomy Left 08/07/2014    Procedure: KNEE ARTHROSCOPY WITH MEDIAL MENISECTOMY;  Surgeon: Hessie Knows, MD;  Location: [facility] ORS;  Service: Orthopedics;  Laterality: Left;  partial medial menisectomy, plica incsion  . Esophagogastroduodenoscopy (egd) with propofol N/A 11/02/2014    Procedure: ESOPHAGOGASTRODUODENOSCOPY (EGD) WITH PROPOFOL;  Surgeon: Lollie Sails, MD;  Location: [facility] ENDOSCOPY;  Service: Endoscopy;  Laterality: N/A;    Current Outpatient Rx  Name  Route  Sig  Dispense  Refill  . Omega-3 Fatty Acids (FISH OIL CONCENTRATE PO)    Oral   Take 1 capsule by mouth daily.         . pantoprazole (PROTONIX) 40 MG tablet   Oral   Take 40 mg by mouth every morning.         . simvastatin (ZOCOR) 20 MG tablet   Oral   Take 20 mg by mouth at bedtime.         Marland Kitchen HYDROcodone-acetaminophen (NORCO) 5-325 MG per tablet   Oral   Take 1-2 tablets by mouth every 6 (six) hours as needed for moderate pain.   40 tablet   0     Allergies Review of patient's allergies indicates no known allergies.  Family History  Problem Relation Age of Onset  . Leukemia Mother   . Leukemia Sister     Social History Social History  Substance Use Topics  . Smoking status: Former Smoker    Types: Cigarettes  . Smokeless tobacco: Former Systems developer    Types: Chew  . Alcohol Use: No    Review of Systems Constitutional: Subjective fever. Eyes: No visual changes. ENT: No sore throat. Positive runny nose, nasal congestion and cough. Cardiovascular: Denies chest pain. Respiratory: Denies shortness of breath. Gastrointestinal: No abdominal pain.  No nausea, no vomiting.  No diarrhea.  No constipation. Genitourinary: Negative for dysuria. Musculoskeletal: Negative for back pain. Skin: Negative for rash. Neurological: Negative for headaches, focal weakness or numbness.  10-point ROS otherwise negative.  ____________________________________________   PHYSICAL EXAM:  VITAL SIGNS: ED Triage  Vitals  Enc Vitals Group     BP 04/19/15 0958 107/60 mmHg     Pulse Rate 04/19/15 0958 104 Recheck 82     Resp 04/19/15 0958 19     Temp 04/19/15 0958 99.7 F (37.6 C)     Temp Source 04/19/15 0958 Oral     SpO2 04/19/15 0958 96 %     Weight 04/19/15 0958 192 lb (87.091 kg)     Height 04/19/15 0958 5\' 6"  (1.676 m)     Head Cir --      Peak Flow --      Pain Score 04/19/15 1004 8     Pain Loc --      Pain Edu? --      Excl. in [place]? --     Constitutional: Alert and oriented. Well appearing and in no acute distress. Eyes: Conjunctivae  are normal. PERRL. EOMI. Head: Atraumatic. No sinus tenderness to palpation. No swelling. No erythema.  Ears: no erythema, normal TMs bilaterally.   Nose: Nasal condition with clear rhinorrhea.  Mouth/Throat: Mucous membranes are moist.  Oropharynx non-erythematous. No tonsillar swelling or exudate. Neck: No stridor.  No cervical spine tenderness to palpation. Hematological/Lymphatic/Immunilogical: No cervical lymphadenopathy. Cardiovascular: Normal rate, regular rhythm. Grossly normal heart sounds.  Good peripheral circulation. Respiratory: Normal respiratory effort.  No retractions. Lungs CTAB. No wheezes, rales or rhonchi. Dry occasional cough noted in room. Gastrointestinal: Soft and nontender. No distention. Normal Bowel sounds. No CVA tenderness. Musculoskeletal: No lower or upper extremity tenderness nor edema.  No calf tenderness bilaterally.  Neurologic:  Normal speech and language. No gross focal neurologic deficits are appreciated. No gait instability. Skin:  Skin is warm, dry and intact. No rash noted. Psychiatric: Mood and affect are normal. Speech and behavior are normal.  ____________________________________________   LABS (all labs ordered are listed, but only abnormal results are displayed)  Labs Reviewed  RAPID INFLUENZA A&B ANTIGENS ([facility] ONLY)  RAPID STREP SCREEN (NOT AT [facility])  CULTURE, GROUP A STREP [facility])   INITIAL IMPRESSION / ASSESSMENT AND PLAN / ED COURSE  Pertinent labs & imaging results that were available during my care of the patient were reviewed by me and considered in my medical decision making (see chart for details).  Well-appearing patient. No acute distress. Presents for the complaints of 1 day history of sudden onset of redness, nasal congestion, body aches, cough and fever. States cough is primarily at night.  Reports continues to eat and drink well. Lungs clear throughout. Abdomen soft and nontender.Very well-appearing patient. Suspect viral upper  infection such as influenza.tylenol 1gm orally in urgent care x one.   Influenza negative. However discussed in detail with patient and clinical appearance and subjective report suspicious of influenza. Discussed treatment with oral Tamiflu. Patient states the prefer to go ahead be treated with Tamiflu.Discussed indication, risks and benefits of medications with patient. Will treat with oral Tamiflu and when necessary cough present with codeine. Encourage rest, fluids, over-the-counter Tylenol or ibuprofen as needed. Encourage PCP follow up as needed.  Discussed follow up with Primary care physician this week. Discussed follow up and return parameters including no resolution or any worsening concerns. Patient verbalized understanding and agreed to plan.   ____________________________________________   FINAL CLINICAL IMPRESSION(S) / ED DIAGNOSES  Final diagnoses:  Viral illness      Note: This dictation was prepared with Dragon dictation along with smaller phrase technology. Any transcriptional errors that result from this process are unintentional.    Mendel Ryder  Sabra Heck, NP 04/19/15 1126  Marylene Land, NP 04/19/15 1213

## 2015-04-19 NOTE — Discharge Instructions (Signed)
Take medication as prescribed. Rest. Drink plenty of fluids. Take over the counter tylenol or ibuprofen as needed for pain or fever.   Follow up with your primary care physician this week as needed. Return to Urgent care for new or worsening concerns.   Viral Infections A viral infection can be caused by different types of viruses.Most viral infections are not serious and resolve on their own. However, some infections may cause severe symptoms and may lead to further complications. SYMPTOMS Viruses can frequently cause:  Minor sore throat.  Aches and pains.  Headaches.  Runny nose.  Different types of rashes.  Watery eyes.  Tiredness.  Cough.  Loss of appetite.  Gastrointestinal infections, resulting in nausea, vomiting, and diarrhea. These symptoms do not respond to antibiotics because the infection is not caused by bacteria. However, you might catch a bacterial infection following the viral infection. This is sometimes called a "superinfection." Symptoms of such a bacterial infection may include:  Worsening sore throat with pus and difficulty swallowing.  Swollen neck glands.  Chills and a high or persistent fever.  Severe headache.  Tenderness over the sinuses.  Persistent overall ill feeling (malaise), muscle aches, and tiredness (fatigue).  Persistent cough.  Yellow, green, or brown mucus production with coughing. HOME CARE INSTRUCTIONS   Only take over-the-counter or prescription medicines for pain, discomfort, diarrhea, or fever as directed by your caregiver.  Drink enough water and fluids to keep your urine clear or pale yellow. Sports drinks can provide valuable electrolytes, sugars, and hydration.  Get plenty of rest and maintain proper nutrition. Soups and broths with crackers or rice are fine. SEEK IMMEDIATE MEDICAL CARE IF:   You have severe headaches, shortness of breath, chest pain, neck pain, or an unusual rash.  You have uncontrolled vomiting,  diarrhea, or you are unable to keep down fluids.  You or your child has an oral temperature above 102 F (38.9 C), not controlled by medicine.  Your baby is older than 3 months with a rectal temperature of 102 F (38.9 C) or higher.  Your baby is 17 months old or younger with a rectal temperature of 100.4 F (38 C) or higher. MAKE SURE YOU:   Understand these instructions.  Will watch your condition.  Will get help right away if you are not doing well or get worse.   This information is not intended to replace advice given to you by your health care provider. Make sure you discuss any questions you have with your health care provider.   Document Released: 11/19/2004 Document Revised: 05/04/2011 Document Reviewed: 07/18/2014 Elsevier Interactive Patient Education [employer].

## 2015-04-21 LAB — CULTURE, GROUP A STREP (THRC)

## 2015-05-22 ENCOUNTER — Emergency Department: Payer: Commercial Managed Care - HMO | Admitting: Anesthesiology

## 2015-05-22 ENCOUNTER — Emergency Department
Admission: EM | Admit: 2015-05-22 | Discharge: 2015-05-22 | Disposition: A | Discharge status: Home / Self Care | Payer: Commercial Managed Care - HMO | Attending: Emergency Medicine | Admitting: Emergency Medicine

## 2015-05-22 ENCOUNTER — Emergency Department: Payer: Commercial Managed Care - HMO

## 2015-05-22 ENCOUNTER — Emergency Department
Admit: 2015-05-22 | Discharge: 2015-05-22 | Disposition: A | Discharge status: Home / Self Care | Payer: Commercial Managed Care - HMO | Attending: Orthopedic Surgery | Admitting: Orthopedic Surgery

## 2015-05-22 ENCOUNTER — Encounter
Admission: EM | Disposition: A | Discharge status: Home / Self Care | Payer: Self-pay | Source: Home / Self Care | Attending: Emergency Medicine

## 2015-05-22 ENCOUNTER — Encounter: Payer: Self-pay | Admitting: *Deleted

## 2015-05-22 DIAGNOSIS — S81011A Laceration without foreign body, right knee, initial encounter: Secondary | ICD-10-CM | POA: Insufficient documentation

## 2015-05-22 DIAGNOSIS — S00432A Contusion of left ear, initial encounter: Secondary | ICD-10-CM | POA: Insufficient documentation

## 2015-05-22 DIAGNOSIS — S0990XA Unspecified injury of head, initial encounter: Secondary | ICD-10-CM | POA: Diagnosis not present

## 2015-05-22 DIAGNOSIS — S20312A Abrasion of left front wall of thorax, initial encounter: Secondary | ICD-10-CM | POA: Insufficient documentation

## 2015-05-22 DIAGNOSIS — S80812A Abrasion, left lower leg, initial encounter: Secondary | ICD-10-CM | POA: Insufficient documentation

## 2015-05-22 DIAGNOSIS — K219 Gastro-esophageal reflux disease without esophagitis: Secondary | ICD-10-CM | POA: Insufficient documentation

## 2015-05-22 DIAGNOSIS — Z9889 Other specified postprocedural states: Secondary | ICD-10-CM | POA: Diagnosis not present

## 2015-05-22 DIAGNOSIS — Y998 Other external cause status: Secondary | ICD-10-CM | POA: Diagnosis not present

## 2015-05-22 DIAGNOSIS — G5601 Carpal tunnel syndrome, right upper limb: Secondary | ICD-10-CM | POA: Insufficient documentation

## 2015-05-22 DIAGNOSIS — S00412A Abrasion of left ear, initial encounter: Secondary | ICD-10-CM | POA: Diagnosis not present

## 2015-05-22 DIAGNOSIS — S52571A Other intraarticular fracture of lower end of right radius, initial encounter for closed fracture: Secondary | ICD-10-CM | POA: Insufficient documentation

## 2015-05-22 DIAGNOSIS — Z87442 Personal history of urinary calculi: Secondary | ICD-10-CM | POA: Diagnosis not present

## 2015-05-22 DIAGNOSIS — S86822A Laceration of other muscle(s) and tendon(s) at lower leg level, left leg, initial encounter: Secondary | ICD-10-CM | POA: Diagnosis not present

## 2015-05-22 DIAGNOSIS — Y9289 Other specified places as the place of occurrence of the external cause: Secondary | ICD-10-CM | POA: Diagnosis not present

## 2015-05-22 DIAGNOSIS — Z87891 Personal history of nicotine dependence: Secondary | ICD-10-CM | POA: Insufficient documentation

## 2015-05-22 DIAGNOSIS — S5291XA Unspecified fracture of right forearm, initial encounter for closed fracture: Secondary | ICD-10-CM

## 2015-05-22 DIAGNOSIS — Z79899 Other long term (current) drug therapy: Secondary | ICD-10-CM | POA: Diagnosis not present

## 2015-05-22 DIAGNOSIS — S81012A Laceration without foreign body, left knee, initial encounter: Secondary | ICD-10-CM | POA: Insufficient documentation

## 2015-05-22 DIAGNOSIS — S59911A Unspecified injury of right forearm, initial encounter: Secondary | ICD-10-CM | POA: Diagnosis present

## 2015-05-22 DIAGNOSIS — N189 Chronic kidney disease, unspecified: Secondary | ICD-10-CM | POA: Insufficient documentation

## 2015-05-22 DIAGNOSIS — E785 Hyperlipidemia, unspecified: Secondary | ICD-10-CM | POA: Diagnosis not present

## 2015-05-22 DIAGNOSIS — W19XXXA Unspecified fall, initial encounter: Secondary | ICD-10-CM

## 2015-05-22 DIAGNOSIS — Y93E5 Activity, floor mopping and cleaning: Secondary | ICD-10-CM | POA: Diagnosis not present

## 2015-05-22 DIAGNOSIS — W11XXXA Fall on and from ladder, initial encounter: Secondary | ICD-10-CM | POA: Insufficient documentation

## 2015-05-22 HISTORY — PX: CARPAL TUNNEL RELEASE: SHX101

## 2015-05-22 HISTORY — PX: OPEN REDUCTION INTERNAL FIXATION (ORIF) DISTAL RADIAL FRACTURE: SHX5989

## 2015-05-22 HISTORY — PX: IRRIGATION AND DEBRIDEMENT KNEE: SHX5185

## 2015-05-22 LAB — CBC WITH DIFFERENTIAL/PLATELET
BASOS ABS: 0.1 10*3/uL (ref 0–0.1)
Basophils Relative: 1 %
EOS ABS: 0.5 10*3/uL (ref 0–0.7)
EOS PCT: 8 %
HCT: 41.9 % (ref 40.0–52.0)
Hemoglobin: 14.3 g/dL (ref 13.0–18.0)
Lymphocytes Relative: 29 %
Lymphs Abs: 1.8 10*3/uL (ref 1.0–3.6)
MCH: 31 pg (ref 26.0–34.0)
MCHC: 34.2 g/dL (ref 32.0–36.0)
MCV: 90.9 fL (ref 80.0–100.0)
MONO ABS: 0.6 10*3/uL (ref 0.2–1.0)
Monocytes Relative: 10 %
Neutro Abs: 3.4 10*3/uL (ref 1.4–6.5)
Neutrophils Relative %: 52 %
PLATELETS: 231 10*3/uL (ref 150–440)
RBC: 4.62 MIL/uL (ref 4.40–5.90)
RDW: 13 % (ref 11.5–14.5)
WBC: 6.4 10*3/uL (ref 3.8–10.6)

## 2015-05-22 LAB — BASIC METABOLIC PANEL
Anion gap: 5 (ref 5–15)
BUN: 13 mg/dL (ref 6–20)
CALCIUM: 8.9 mg/dL (ref 8.9–10.3)
CO2: 28 mmol/L (ref 22–32)
CREATININE: 1.24 mg/dL (ref 0.61–1.24)
Chloride: 104 mmol/L (ref 101–111)
GFR calc Af Amer: >60 mL/min (ref >60)
GLUCOSE: 161 mg/dL — AB (ref 65–99)
Potassium: 3.5 mmol/L (ref 3.5–5.1)
SODIUM: 137 mmol/L (ref 135–145)

## 2015-05-22 SURGERY — OPEN REDUCTION INTERNAL FIXATION (ORIF) DISTAL RADIUS FRACTURE
Anesthesia: General | Laterality: Right | Wound class: Clean

## 2015-05-22 MED ORDER — FENTANYL CITRATE (PF) 100 MCG/2ML IJ SOLN
INTRAMUSCULAR | Status: DC | PRN
  Administered 2015-05-22: 100 ug via INTRAVENOUS
  Administered 2015-05-22: 50 ug via INTRAVENOUS
  Administered 2015-05-22: 25 ug via INTRAVENOUS
  Administered 2015-05-22: 50 ug via INTRAVENOUS

## 2015-05-22 MED ORDER — CEFAZOLIN SODIUM-DEXTROSE 2-4 GM/100ML-% IV SOLN: 2.0000 g | Freq: Once | INTRAVENOUS | Status: DC

## 2015-05-22 MED ORDER — METOCLOPRAMIDE HCL 10 MG PO TABS: 5.0000 mg | ORAL_TABLET | Freq: Three times a day (TID) | ORAL | Status: DC | PRN

## 2015-05-22 MED ORDER — ONDANSETRON HCL 4 MG/2ML IJ SOLN: 4.0000 mg | Freq: Once | INTRAMUSCULAR | Status: DC | PRN

## 2015-05-22 MED ORDER — ONDANSETRON HCL 4 MG PO TABS: 4.0000 mg | ORAL_TABLET | Freq: Four times a day (QID) | ORAL | Status: DC | PRN

## 2015-05-22 MED ORDER — HYDROMORPHONE HCL 1 MG/ML IJ SOLN
INTRAMUSCULAR | Status: AC
Start: 2015-05-22 — End: 2015-05-22
  Administered 2015-05-22: 0.5 mg via INTRAVENOUS
  Filled 2015-05-22: qty 1

## 2015-05-22 MED ORDER — METOCLOPRAMIDE HCL 5 MG/ML IJ SOLN: 10.0000 mg | Freq: Once | INTRAMUSCULAR | Status: DC

## 2015-05-22 MED ORDER — ONDANSETRON HCL 4 MG/2ML IJ SOLN
4.0000 mg | Freq: Once | INTRAMUSCULAR | Status: AC
  Administered 2015-05-22: 4 mg via INTRAVENOUS
  Filled 2015-05-22: qty 2

## 2015-05-22 MED ORDER — HYDROCODONE-ACETAMINOPHEN 5-325 MG PO TABS
1.0000 | ORAL_TABLET | ORAL | Status: DC | PRN
  Administered 2015-05-22: 1 via ORAL

## 2015-05-22 MED ORDER — KETOROLAC TROMETHAMINE 30 MG/ML IJ SOLN
INTRAMUSCULAR | Status: DC | PRN
  Administered 2015-05-22: 30 mg via INTRAVENOUS

## 2015-05-22 MED ORDER — CEFAZOLIN SODIUM-DEXTROSE 2-4 GM/100ML-% IV SOLN
2.0000 g | INTRAVENOUS | Status: AC
  Administered 2015-05-22: 2 g via INTRAVENOUS
  Filled 2015-05-22: qty 100

## 2015-05-22 MED ORDER — ONDANSETRON HCL 4 MG/2ML IJ SOLN
INTRAMUSCULAR | Status: DC | PRN
  Administered 2015-05-22: 4 mg via INTRAVENOUS

## 2015-05-22 MED ORDER — HYDROCODONE-ACETAMINOPHEN 5-325 MG PO TABS: 1.0000 | ORAL_TABLET | Freq: Four times a day (QID) | ORAL | Status: DC | PRN

## 2015-05-22 MED ORDER — BUPIVACAINE HCL 0.5 % IJ SOLN
INTRAMUSCULAR | Status: DC | PRN
Start: 2015-05-22 — End: 2015-05-22
  Administered 2015-05-22: 10 mL

## 2015-05-22 MED ORDER — HYDROCODONE-ACETAMINOPHEN 5-325 MG PO TABS
ORAL_TABLET | ORAL | Status: AC
  Filled 2015-05-22: qty 1

## 2015-05-22 MED ORDER — FENTANYL CITRATE (PF) 100 MCG/2ML IJ SOLN: 25.0000 ug | INTRAMUSCULAR | Status: DC | PRN

## 2015-05-22 MED ORDER — METOCLOPRAMIDE HCL 5 MG/ML IJ SOLN: 5.0000 mg | Freq: Three times a day (TID) | INTRAMUSCULAR | Status: DC | PRN

## 2015-05-22 MED ORDER — FENTANYL CITRATE (PF) 100 MCG/2ML IJ SOLN
50.0000 ug | Freq: Once | INTRAMUSCULAR | Status: AC
  Administered 2015-05-22: 50 ug via INTRAVENOUS
  Filled 2015-05-22: qty 2

## 2015-05-22 MED ORDER — MORPHINE SULFATE (PF) 4 MG/ML IV SOLN
6.0000 mg | Freq: Once | INTRAVENOUS | Status: AC
  Administered 2015-05-22: 6 mg via INTRAVENOUS
  Filled 2015-05-22: qty 2

## 2015-05-22 MED ORDER — DEXAMETHASONE SODIUM PHOSPHATE 10 MG/ML IJ SOLN
INTRAMUSCULAR | Status: DC | PRN
  Administered 2015-05-22: 10 mg via INTRAVENOUS

## 2015-05-22 MED ORDER — PROPOFOL 10 MG/ML IV BOLUS
INTRAVENOUS | Status: DC | PRN
  Administered 2015-05-22: 150 mg via INTRAVENOUS

## 2015-05-22 MED ORDER — MIDAZOLAM HCL 2 MG/2ML IJ SOLN
INTRAMUSCULAR | Status: DC | PRN
  Administered 2015-05-22 (×2): 1 mg via INTRAVENOUS

## 2015-05-22 MED ORDER — HYDROMORPHONE HCL 1 MG/ML IJ SOLN
0.5000 mg | Freq: Once | INTRAMUSCULAR | Status: AC
  Administered 2015-05-22: 0.5 mg via INTRAVENOUS

## 2015-05-22 MED ORDER — MORPHINE SULFATE (PF) 2 MG/ML IV SOLN: 2.0000 mg | INTRAVENOUS | Status: DC | PRN

## 2015-05-22 MED ORDER — SODIUM CHLORIDE 0.9 % IV SOLN
INTRAVENOUS | Status: DC
  Administered 2015-05-22: 12:00:00 via INTRAVENOUS

## 2015-05-22 MED ORDER — BUPIVACAINE HCL (PF) 0.5 % IJ SOLN
INTRAMUSCULAR | Status: AC
  Filled 2015-05-22: qty 30

## 2015-05-22 MED ORDER — SODIUM CHLORIDE 0.9 % IV SOLN: INTRAVENOUS | Status: DC

## 2015-05-22 MED ORDER — SUCCINYLCHOLINE CHLORIDE 20 MG/ML IJ SOLN
INTRAMUSCULAR | Status: DC | PRN
  Administered 2015-05-22: 100 mg via INTRAVENOUS

## 2015-05-22 MED ORDER — ONDANSETRON HCL 4 MG/2ML IJ SOLN: 4.0000 mg | Freq: Four times a day (QID) | INTRAMUSCULAR | Status: DC | PRN

## 2015-05-22 MED ORDER — NEOMYCIN-POLYMYXIN B GU 40-200000 IR SOLN
Status: AC
  Filled 2015-05-22: qty 2

## 2015-05-22 MED ORDER — NEOMYCIN-POLYMYXIN B GU 40-200000 IR SOLN
Status: DC | PRN
  Administered 2015-05-22: 2 mL

## 2015-05-22 SURGICAL SUPPLY — 52 items
BANDAGE ACE 3X5.8 VEL STRL LF (GAUZE/BANDAGES/DRESSINGS) ×4 IMPLANT
BANDAGE ACE 4X5 VEL STRL LF (GAUZE/BANDAGES/DRESSINGS) ×4 IMPLANT
BIT DRILL 2 FAST STEP (BIT) ×4 IMPLANT
BIT DRILL 2.5X4 QC (BIT) ×4 IMPLANT
BNDG ESMARK 4X12 TAN STRL LF (GAUZE/BANDAGES/DRESSINGS) ×4 IMPLANT
CANISTER SUCT 1200ML W/VALVE (MISCELLANEOUS) ×4 IMPLANT
CHLORAPREP W/TINT 26ML (MISCELLANEOUS) ×8 IMPLANT
CUFF TOURN 18 STER (MISCELLANEOUS) ×4 IMPLANT
DRAPE FLUOR MINI C-ARM 54X84 (DRAPES) ×4 IMPLANT
ELECT CAUTERY NEEDLE 2.0 MIC (NEEDLE) IMPLANT
ELECT CAUTERY NEEDLE TIP 1.0 (MISCELLANEOUS)
ELECT REM PT RETURN 9FT ADLT (ELECTROSURGICAL) ×4
ELECTRODE CAUTERY NEDL TIP 1.0 (MISCELLANEOUS) IMPLANT
ELECTRODE REM PT RTRN 9FT ADLT (ELECTROSURGICAL) ×2 IMPLANT
GAUZE PETRO XEROFOAM 1X8 (MISCELLANEOUS) ×8 IMPLANT
GAUZE SPONGE 4X4 12PLY STRL (GAUZE/BANDAGES/DRESSINGS) ×4 IMPLANT
GAUZE STRETCH 2X75IN STRL (MISCELLANEOUS) ×4 IMPLANT
GLOVE BIOGEL M STRL SZ7.5 (GLOVE) ×4 IMPLANT
GLOVE BIOGEL PI IND STRL 9 (GLOVE) ×2 IMPLANT
GLOVE BIOGEL PI INDICATOR 9 (GLOVE) ×2
GLOVE SURG ORTHO 9.0 STRL STRW (GLOVE) ×4 IMPLANT
GOWN SPECIALTY ULTRA XL (MISCELLANEOUS) ×4 IMPLANT
GOWN STRL REUS W/ TWL LRG LVL3 (GOWN DISPOSABLE) ×2 IMPLANT
GOWN STRL REUS W/TWL 2XL LVL3 (GOWN DISPOSABLE) ×4 IMPLANT
GOWN STRL REUS W/TWL LRG LVL3 (GOWN DISPOSABLE) ×2
KIT RM TURNOVER STRD PROC AR (KITS) ×4 IMPLANT
NDL SAFETY 25GX1.5 (NEEDLE) ×4 IMPLANT
NEEDLE FILTER BLUNT 18X 1/2SAF (NEEDLE) ×2
NEEDLE FILTER BLUNT 18X1 1/2 (NEEDLE) ×2 IMPLANT
NS IRRIG 500ML POUR BTL (IV SOLUTION) ×4 IMPLANT
PACK EXTREMITY ARMC (MISCELLANEOUS) ×4 IMPLANT
PAD CAST CTTN 4X4 STRL (SOFTGOODS) IMPLANT
PADDING CAST BLEND 3X4 NS (MISCELLANEOUS) ×2 IMPLANT
PADDING CAST BLEND 3X4YD NS (MISCELLANEOUS) ×2
PADDING CAST COTTON 4X4 STRL (SOFTGOODS)
PEG SUBCHONDRAL SMOOTH 2.0X16 (Peg) ×4 IMPLANT
PEG SUBCHONDRAL SMOOTH 2.0X18 (Peg) ×4 IMPLANT
PEG SUBCHONDRAL SMOOTH 2.0X22 (Peg) ×8 IMPLANT
PEG SUBCHONDRAL SMOOTH 2.0X24 (Peg) ×4 IMPLANT
PLATE SHORT 24.4X51.3 RT (Plate) ×4 IMPLANT
SCREW BN 12X3.5XNS CORT TI (Screw) ×2 IMPLANT
SCREW CORT 3.5X12 (Screw) ×2 IMPLANT
SCREW CORT 3.5X14 LNG (Screw) ×8 IMPLANT
SPLINT CAST 1 STEP 3X12 (MISCELLANEOUS) ×4 IMPLANT
STOCKINETTE STRL 4IN 9604848 (GAUZE/BANDAGES/DRESSINGS) ×4 IMPLANT
STOCKINETTE STRL 6IN 960660 (GAUZE/BANDAGES/DRESSINGS) ×4 IMPLANT
STRAP SAFETY BODY (MISCELLANEOUS) ×4 IMPLANT
SUT ETHILON 4-0 (SUTURE) ×2
SUT ETHILON 4-0 FS2 18XMFL BLK (SUTURE) ×2
SUT VICRYL 3-0 27IN (SUTURE) ×4 IMPLANT
SUTURE ETHLN 4-0 FS2 18XMF BLK (SUTURE) ×2 IMPLANT
SYR 3ML LL SCALE MARK (SYRINGE) ×4 IMPLANT

## 2015-05-22 NOTE — ED Notes (Signed)
Report given to OR nurse

## 2015-05-22 NOTE — Anesthesia Preprocedure Evaluation (Addendum)
Anesthesia Evaluation  Patient identified by MRN, date of birth, ID band Patient awake    Reviewed: Allergy & Precautions, NPO status , Patient's Chart, lab work & pertinent test results  History of Anesthesia Complications Negative for: history of anesthetic complications  Airway Mallampati: II       Dental   Pulmonary neg pulmonary ROS, former smoker,           Cardiovascular negative cardio ROS       Neuro/Psych negative neurological ROS     GI/Hepatic hiatal hernia, GERD  Medicated and Poorly Controlled,  Endo/Other    Renal/GU Renal InsufficiencyRenal disease     Musculoskeletal   Abdominal   Peds  Hematology   Anesthesia Other Findings   Reproductive/Obstetrics                            Anesthesia Physical Anesthesia Plan  ASA: II  Anesthesia Plan: General   Post-op Pain Management:    Induction: Intravenous  Airway Management Planned: Oral ETT  Additional Equipment:   Intra-op Plan:   Post-operative Plan:   Informed Consent: I have reviewed the patients History and Physical, chart, labs and discussed the procedure including the risks, benefits and alternatives for the proposed anesthesia with the patient or authorized representative who has indicated his/her understanding and acceptance.     Plan Discussed with:   Anesthesia Plan Comments:         Anesthesia Quick Evaluation

## 2015-05-22 NOTE — H&P (Signed)
Subjective:   Patient is a 65 y.o. male presents with fall from a ladder at home approximately 10 feet. Onset of symptoms was abrupt starting 2 hours ago with gradually worsening course since that time. The pain is located left anterior knee and right wrist and hand. Patient describes the pain as sharp continuous and rated as severe. Pain has been associated with increasing numbness to the right hand. Patient denies prodromal symptoms of carpal tunnel. Symptoms are aggravated by any motion. Symptoms improve with laying still. Past history includes prior knee arthroscopy.  Previous studies include x-ray showing a completely displaced distal radius fracture with mild comminution..  There are no active problems to display for this patient.  Past Medical History  Diagnosis Date  . Chronic kidney disease     kidney stones  . Hyperlipidemia   . Hepatic hemangioma   . GERD (gastroesophageal reflux disease)   . Barrett esophagus   . Kidney stone   . History of hiatal hernia     Past Surgical History  Procedure Laterality Date  . Nose surgery    . Knee arthroscopy Right   . Colonoscopy    . Esophagogastroduodenoscopy    . Knee arthroscopy Left 08/07/2014  . Knee arthroscopy with medial menisectomy Left 08/07/2014    Procedure: KNEE ARTHROSCOPY WITH MEDIAL MENISECTOMY;  Surgeon: Hessie Knows, MD;  Location: [facility] ORS;  Service: Orthopedics;  Laterality: Left;  partial medial menisectomy, plica incsion  . Esophagogastroduodenoscopy (egd) with propofol N/A 11/02/2014    Procedure: ESOPHAGOGASTRODUODENOSCOPY (EGD) WITH PROPOFOL;  Surgeon: Lollie Sails, MD;  Location: [facility] ENDOSCOPY;  Service: Endoscopy;  Laterality: N/A;     (Not in a hospital admission) No Known Allergies  Social History  Substance Use Topics  . Smoking status: Former Smoker    Types: Cigarettes  . Smokeless tobacco: Former Systems developer    Types: Chew  . Alcohol Use: No    Family History  Problem Relation Age of Onset  .  Leukemia Mother   . Leukemia Sister     Review of Systems Pertinent items are noted in HPI.  Objective:   Patient Vitals for the past 8 hrs:  BP Temp Temp src Pulse Resp SpO2 Height Weight  05/22/15 1015 (!) 125/99 mmHg - - 69 - 96 % - -  05/22/15 0947 (!) 143/90 mmHg - - 72 18 98 % - -  05/22/15 0823 106/83 mmHg 97.8 F (36.6 C) Oral 72 18 99 % 5\' 7"  (1.702 m) 87.544 kg (193 lb)          BP 125/99 mmHg  Pulse 69  Temp(Src) 97.8 F (36.6 C) (Oral)  Resp 18  Ht 5\' 7"  (1.702 m)  Wt 87.544 kg (193 lb)  BMI 30.22 kg/m2  SpO2 96% General appearance: alert, cooperative, appears stated age and mild distress Head: Normocephalic, without obvious abnormality, atraumatic Neck: no adenopathy, no carotid bruit, no JVD, supple, symmetrical, trachea midline and No tenderness Lungs: clear to auscultation bilaterally Chest wall: no tenderness, Abrasion over left anterior chest wall Heart: regular rate and rhythm, S1, S2 normal, no murmur, click, rub or gallop Extremities: Silver-fork deformity to right distal radius with splint in place. Left knee there is approximately 4 cm laceration at the distal end of the patellar tendon with tendon possibly exposed Pulses: 2+ and symmetric Neurologic: Sensory: Diminished sensation to thumb index and middle finger    Assessment:   Active Problems:   * No active hospital problems. *  Right distal radius  fracture with median nerve compression acute, left knee laceration Plan:   With progression of his median nerve symptoms he needs urgent ORIF and median nerve decompression, left knee laceration may be intra-articular and also is urgent. Risks benefits possible complications discussed with patient

## 2015-05-22 NOTE — ED Notes (Signed)
Pt arrives with a fall off a roof this AM, states about 10 feet while trying to clean gutters, c-collared placed upon pt arrival, abrasion to left knee, guarding right arm, denies any LOC, denies use of blood thinners, EDP brought to bedside, pt awake and alert upon arrival, wife at bedside, contusion of left ear

## 2015-05-22 NOTE — Discharge Instructions (Signed)

## 2015-05-22 NOTE — Anesthesia Procedure Notes (Signed)
Procedure Name: Intubation Date/Time: 05/22/2015 12:14 PM Performed by: Jonna Clark Pre-anesthesia Checklist: Patient identified, Patient being monitored, Timeout performed, Emergency Drugs available and Suction available Patient Re-evaluated:Patient Re-evaluated prior to inductionOxygen Delivery Method: Circle system utilized Preoxygenation: Pre-oxygenation with 100% oxygen Intubation Type: IV induction Ventilation: Mask ventilation without difficulty Laryngoscope Size: Mac and 3 Grade View: Grade I Tube type: Oral Tube size: 7.5 mm Number of attempts: 1 Placement Confirmation: ETT inserted through vocal cords under direct vision,  positive ETCO2 and breath sounds checked- equal and bilateral Secured at: 21 cm Tube secured with: Tape Dental Injury: Teeth and Oropharynx as per pre-operative assessment

## 2015-05-22 NOTE — ED Notes (Signed)
Pt in radiology dept.  

## 2015-05-22 NOTE — Anesthesia Postprocedure Evaluation (Signed)
Anesthesia Post Note  Patient: Maurice Lucas  Procedure(s) Performed: Procedure(s) (LRB): OPEN REDUCTION INTERNAL FIXATION (ORIF) DISTAL RADIAL FRACTURE (Right) IRRIGATION AND DEBRIDEMENT KNEE WITH PATELLA TENDON REPAIR AND LACERATION REPAIR (Left) CARPAL TUNNEL RELEASE (Right)  Patient location during evaluation: PACU Anesthesia Type: General Level of consciousness: awake and alert Pain management: pain level controlled Vital Signs Assessment: post-procedure vital signs reviewed and stable Respiratory status: spontaneous breathing and respiratory function stable Cardiovascular status: stable Anesthetic complications: no    Last Vitals:  Filed Vitals:   05/22/15 1404 05/22/15 1420  BP: 144/85 127/98  Pulse: 67 85  Temp:    Resp: 14 14    Last Pain:  Filed Vitals:   05/22/15 1430  PainSc: 0-No pain                 [name]

## 2015-05-22 NOTE — Transfer of Care (Signed)
Immediate Anesthesia Transfer of Care Note  Patient: Maurice Lucas  Procedure(s) Performed: Procedure(s): OPEN REDUCTION INTERNAL FIXATION (ORIF) DISTAL RADIAL FRACTURE (Right) IRRIGATION AND DEBRIDEMENT KNEE WITH PATELLA TENDON REPAIR AND LACERATION REPAIR (Left) CARPAL TUNNEL RELEASE (Right)  Patient Location: PACU  Anesthesia Type:General  Level of Consciousness: patient cooperative and lethargic  Airway & Oxygen Therapy: Patient Spontanous Breathing and Patient connected to face mask oxygen  Post-op Assessment: Report given to RN and Post -op Vital signs reviewed and stable  Post vital signs: Reviewed and stable  Last Vitals:  Filed Vitals:   05/22/15 1132 05/22/15 1404  BP: 129/86 144/85  Pulse: 73 67  Temp: 36.4 C   Resp: 18 14    Complications: No apparent anesthesia complications

## 2015-05-22 NOTE — ED Provider Notes (Addendum)
[facility] Emergency Department Provider Note  ____________________________________________   I have reviewed the triage vital signs and the nursing notes.   HISTORY  Chief Complaint Fall    HPI Maurice Lucas is a 65 y.o. male presents today complaining of right arm pain from a fall. He was 10 feet up when he fell while cleaning his gutters. He is not on Coumadin or any blood thinners. He bumped his head but did not pass out. He states that he has no back pain or neck pain no nausea no vomiting, no loss of consciousness. He is not on aspirin. His tetanus is up-to-date.Has a scrape to his leg but does not feel that is broken has a scrape to his ear but does not feel that he had any other significant head injury.    Location right arm Radiation none [facility] Duration since fall Timing fall Severity significant Associated sxs none PriorTreatment none   Past Medical History  Diagnosis Date  . Chronic kidney disease     kidney stones  . Hyperlipidemia   . Hepatic hemangioma   . GERD (gastroesophageal reflux disease)   . Barrett esophagus   . Kidney stone   . History of hiatal hernia     There are no active problems to display for this patient.   Past Surgical History  Procedure Laterality Date  . Nose surgery    . Knee arthroscopy Right   . Colonoscopy    . Esophagogastroduodenoscopy    . Knee arthroscopy Left 08/07/2014  . Knee arthroscopy with medial menisectomy Left 08/07/2014    Procedure: KNEE ARTHROSCOPY WITH MEDIAL MENISECTOMY;  Surgeon: Hessie Knows, MD;  Location: [facility] ORS;  Service: Orthopedics;  Laterality: Left;  partial medial menisectomy, plica incsion  . Esophagogastroduodenoscopy (egd) with propofol N/A 11/02/2014    Procedure: ESOPHAGOGASTRODUODENOSCOPY (EGD) WITH PROPOFOL;  Surgeon: Lollie Sails, MD;  Location: [facility] ENDOSCOPY;  Service: Endoscopy;  Laterality: N/A;    Current Outpatient Rx  Name  Route  Sig   Dispense  Refill  . guaiFENesin-codeine 100-10 MG/5ML syrup   Oral   Take 10 mLs by mouth at bedtime as needed for cough.   75 mL   0   . HYDROcodone-acetaminophen (NORCO) 5-325 MG per tablet   Oral   Take 1-2 tablets by mouth every 6 (six) hours as needed for moderate pain.   40 tablet   0   . Omega-3 Fatty Acids (FISH OIL CONCENTRATE PO)   Oral   Take 1 capsule by mouth daily.         Marland Kitchen oseltamivir (TAMIFLU) 75 MG capsule   Oral   Take 1 capsule (75 mg total) by mouth every 12 (twelve) hours.   10 capsule   0   . pantoprazole (PROTONIX) 40 MG tablet   Oral   Take 40 mg by mouth every morning.         . simvastatin (ZOCOR) 20 MG tablet   Oral   Take 20 mg by mouth at bedtime.           Allergies Review of patient's allergies indicates no known allergies.  Family History  Problem Relation Age of Onset  . Leukemia Mother   . Leukemia Sister     Social History Social History  Substance Use Topics  . Smoking status: Former Smoker    Types: Cigarettes  . Smokeless tobacco: Former Systems developer    Types: Chew  . Alcohol Use: No  Review of Systems Constitutional: No fever/chills Eyes: No visual changes. ENT: No sore throat. No stiff neck no neck pain Cardiovascular: Denies chest pain. Respiratory: Denies shortness of breath. Gastrointestinal:   no vomiting.  No diarrhea.  No constipation. Genitourinary: Negative for dysuria. Musculoskeletal: Negative lower extremity swelling Skin: Negative for rash. Neurological: Negative for headaches, focal weakness or numbness. 10-point ROS otherwise negative.  ____________________________________________   PHYSICAL EXAM:  VITAL SIGNS: ED Triage Vitals  Enc Vitals Group     BP 05/22/15 0823 106/83 mmHg     Pulse Rate 05/22/15 0823 72     Resp 05/22/15 0823 18     Temp 05/22/15 0823 97.8 F (36.6 C)     Temp Source 05/22/15 0823 Oral     SpO2 05/22/15 0823 99 %     Weight 05/22/15 0823 193 lb (87.544 kg)      Height 05/22/15 0823 5\' 7"  (1.702 m)     Head Cir --      Peak Flow --      Pain Score 05/22/15 0829 7     Pain Loc --      Pain Edu? --      Excl. in [place]? --     Constitutional: Alert and oriented. Well appearing and in no acute distress.Anxious but nontoxic Eyes: Conjunctivae are normal. PERRL. EOMI. Head: Abrasion to left ear without any significant hematoma noted Nose: No congestion/rhinnorhea. No septal hematoma Mouth/Throat: Mucous membranes are moist.  Oropharynx non-erythematous. No hemotympanum Neck: No stridor.   Nontender to call in place, no tenderness to palpation Cardiovascular: Normal rate, regular rhythm. Grossly normal heart sounds.  Good peripheral circulation. Respiratory: Normal respiratory effort.  No retractions. Lungs CTAB. Abdominal: Soft and nontender. No distention. No guarding no rebound Back:  There is no focal tenderness or step off there is no midline tenderness there are no lesions noted. there is no CVA tenderness Musculoskeletal: No lower extremity tenderness. No joint effusions, no DVT signs strong distal pulses no edema, there is no obvious deformity to the midshaft of the right forearm with strong distal pulses. Neurovascular intact Neurologic:  Normal speech and language. No gross focal neurologic deficits are appreciated.  Skin: laceration to the left pretibial region is noted, abrasion to the left ear, superficial abrasion to the left chest wall Psychiatric: Mood and affect are normal. Speech and behavior are normal.  ____________________________________________   LABS (all labs ordered are listed, but only abnormal results are displayed)  Labs Reviewed - No data to display ____________________________________________  EKG  I personally interpreted any EKGs ordered by me or triage  ____________________________________________  RADIOLOGY  I reviewed any imaging ordered by me or triage that were performed during my shift and, if possible,  patient and/or family made aware of any abnormal findings. ____________________________________________   PROCEDURES  Procedure(s) performed: None  Critical Care performed: None  ____________________________________________   INITIAL IMPRESSION / ASSESSMENT AND PLAN / ED COURSE  Pertinent labs & imaging results that were available during my care of the patient were reviewed by me and considered in my medical decision making (see chart for details).  Patient with a non-syncopal fall from height, given his age we'll obtain CT head and neck as he cannot be clinically cleared of the low suspicion for fracture. No evidence of neurologic impairment. Abdomen is completely benign at this time. Lungs are clear with no evidence of pneumothorax or given mechanism we'll obtain a chest x-ray. Patient with obvious right upper extremity fracture no  other acute significant pathology noted on initial exam  ----------------------------------------- 9:57 AM on 05/22/2015 -----------------------------------------  Talk to Dr. Rudene Christians of orthopedic surgery, patient is at this time neurovascular intact although he has a great of discomfort when moving therefore somewhat limited our evaluation. He will come evaluate the patient for surgical repair of this distal radial fracture. In addition, on further investigation the laceration in his lower leg, I do note that it is deeper than actually expected and does seem to possibly involve the patellar tendon although not a complete rupture. We will irrigate it well and have orthopedic surgery, evaluate that as well. ____________________________________________   FINAL CLINICAL IMPRESSION(S) / ED DIAGNOSES  Final diagnoses:  None      This chart was dictated using voice recognition software.  Despite best efforts to proofread,  errors can occur which can change meaning.     Schuyler Amor, MD 05/22/15 [place], MD 05/22/15 [phone]

## 2015-05-22 NOTE — ED Notes (Signed)
Right arm stablized

## 2015-05-22 NOTE — ED Notes (Signed)
Pt returned from radiology.  Reports pain is worse, 9/10.  See med note.

## 2015-05-22 NOTE — Op Note (Signed)
05/22/2015  2:04 PM  PATIENT:  Maurice Lucas  65 y.o. male  PRE-OPERATIVE DIAGNOSIS:  right forearm fracture,left knee laceration  POST-OPERATIVE DIAGNOSIS:  right forearm fracture,left knee laceration with patellar tendon laceration  PROCEDURE:  Procedure(s): OPEN REDUCTION INTERNAL FIXATION (ORIF) DISTAL RADIAL FRACTURE (Right) IRRIGATION AND DEBRIDEMENT KNEE WITH PATELLA TENDON REPAIR AND LACERATION REPAIR (Left) CARPAL TUNNEL RELEASE (Right) repair of patellar tendon partial laceration  SURGEON: Laurene Footman, MD  ASSISTANTS: None  ANESTHESIA:   general  EBL:  Total I/O In: -  Out: 20 [Blood:20]  BLOOD ADMINISTERED:none  DRAINS: none   LOCAL MEDICATIONS USED:  MARCAINE     SPECIMEN:  No Specimen  DISPOSITION OF SPECIMEN:  N/A  COUNTS:  YES  TOURNIQUET:  46 minutes at 250 mmHg right upper extremity   IMPLANTS:  standard width short DVR plate with multiple screws and pegs  DICTATION: .Dragon Dictation patient brought the operating room and after adequate anesthesia was obtained, right arm was prepped and draped in sterile fashion. After patient identification and timeout procedures completed tourniquet raised. Volar approach was utilized or the FCR tendon. FCR tendon was retracted radially deep fascia incised there was marked swelling of the subcutaneous muscle. The FCR was retracted radially and the pronator elevated off the distal fragment and shaft. The fracture was quite unstable with some comminution and intra-articular extension with multiple fragments present. Fingertrap traction was applied and length was restored the fracture but the distal fragment tended towards the radial styloid side with radial subluxation.  With the fracture held in reduced position a K wire was sent through the styloid to maintain alignment. A short width DVR plate was applied in an appropriate position. 3 cortical screws were placed. Holding the wrist in a reduced position multiple pegs and  a single multidirectional screw was placed in the distal fragment this gave near anatomic alignment. After removing the fingertrap traction  rotation was checked of the DRUJ appeared normal. Checking under fluoroscopy the fracture appeared stable to radial and ulnar deviation as well as flexion extension. This point the carpal tunnel was released carried out with a small incision in line with ring metacarpal subcutaneous tissues tissue spread and the transcarpal ligament and opened with a hemostat placed underneath this protecting underlying structures, release care approximately and distally and complete release was noted. Wounds were then irrigated tourniquet let down and both incisions infiltrated with half percent Sensorcaine total 10 cc as and then closure with 3-0 Vicryl in the distal forearm wound followed by 4-0 nylon for both skin incisions. Xeroform 4 x 4 web roll volar splint and Ace wrap applied. Tourniquet time 46 minutes. The attention was then turned to the left knee where there is a deep laceration this was prepped with Betadine and on expiration of the wound there was involvement of the patellar tendon approximately half the tendon had laceration down to the bone. Some of the central portion was actually at the level of the insertion of this was repaired using #1 Ethibond and a vertical mattress fashion followed by running #1 Ethibond to reinforce the repair. With knee flexion that appeared intact. The wound was then closed loosely with 4-0 nylon. Dressing of Xeroform 4 x 4's web roll and Ace wrap applied   CARE: Discharge to home after PACU  PATIENT DISPOSITION:  PACU - hemodynamically stable.

## 2015-05-22 NOTE — ED Notes (Signed)
Lac to LLE irrigated.  Pt still having pain.  See med note.

## 2015-09-07 ENCOUNTER — Encounter: Payer: Self-pay | Admitting: Emergency Medicine

## 2015-09-07 ENCOUNTER — Emergency Department
Admission: EM | Admit: 2015-09-07 | Discharge: 2015-09-07 | Disposition: A | Discharge status: Home / Self Care | Payer: 59 | Attending: Emergency Medicine | Admitting: Emergency Medicine

## 2015-09-07 DIAGNOSIS — E785 Hyperlipidemia, unspecified: Secondary | ICD-10-CM | POA: Insufficient documentation

## 2015-09-07 DIAGNOSIS — N189 Chronic kidney disease, unspecified: Secondary | ICD-10-CM | POA: Insufficient documentation

## 2015-09-07 DIAGNOSIS — S41111A Laceration without foreign body of right upper arm, initial encounter: Secondary | ICD-10-CM | POA: Insufficient documentation

## 2015-09-07 DIAGNOSIS — Z87891 Personal history of nicotine dependence: Secondary | ICD-10-CM | POA: Diagnosis not present

## 2015-09-07 DIAGNOSIS — W268XXA Contact with other sharp object(s), not elsewhere classified, initial encounter: Secondary | ICD-10-CM | POA: Insufficient documentation

## 2015-09-07 DIAGNOSIS — Y999 Unspecified external cause status: Secondary | ICD-10-CM | POA: Insufficient documentation

## 2015-09-07 DIAGNOSIS — Y929 Unspecified place or not applicable: Secondary | ICD-10-CM | POA: Diagnosis not present

## 2015-09-07 DIAGNOSIS — Y939 Activity, unspecified: Secondary | ICD-10-CM | POA: Diagnosis not present

## 2015-09-07 MED ORDER — LIDOCAINE HCL (PF) 1 % IJ SOLN
5.0000 mL | Freq: Once | INTRAMUSCULAR | Status: AC
  Administered 2015-09-07: 5 mL

## 2015-09-07 MED ORDER — OXYCODONE-ACETAMINOPHEN 5-325 MG PO TABS: 1.0000 | ORAL_TABLET | ORAL | Status: DC | PRN

## 2015-09-07 MED ORDER — LIDOCAINE HCL (PF) 1 % IJ SOLN
INTRAMUSCULAR | Status: AC
  Administered 2015-09-07: 5 mL
  Filled 2015-09-07: qty 5

## 2015-09-07 MED ORDER — BACITRACIN ZINC 500 UNIT/GM EX OINT
TOPICAL_OINTMENT | Freq: Two times a day (BID) | CUTANEOUS | Status: DC
  Administered 2015-09-07: 1 via TOPICAL
  Filled 2015-09-07: qty 0.9

## 2015-09-07 MED ORDER — OXYCODONE-ACETAMINOPHEN 5-325 MG PO TABS
1.0000 | ORAL_TABLET | Freq: Once | ORAL | Status: AC
  Administered 2015-09-07: 1 via ORAL
  Filled 2015-09-07: qty 1

## 2015-09-07 NOTE — Discharge Instructions (Signed)
Laceration Care, Adult  A laceration is a cut that goes through all layers of the skin. The cut also goes into the tissue that is right under the skin. Some cuts heal on their own. Others need to be closed with stitches (sutures), staples, skin adhesive strips, or wound glue. Taking care of your cut lowers your risk of infection and helps your cut to heal better.  HOW TO TAKE CARE OF YOUR CUT  For stitches or staples:  · Keep the wound clean and dry.  · If you were given a bandage (dressing), you should change it at least one time per day or as told by your doctor. You should also change it if it gets wet or dirty.  · Keep the wound completely dry for the first 24 hours or as told by your doctor. After that time, you may take a shower or a bath. However, make sure that the wound is not soaked in water until after the stitches or staples have been removed.  · Clean the wound one time each day or as told by your doctor:    Wash the wound with soap and water.    Rinse the wound with water until all of the soap comes off.    Pat the wound dry with a clean towel. Do not rub the wound.  · After you clean the wound, put a thin layer of antibiotic ointment on it as told by your doctor. This ointment:    Helps to prevent infection.    Keeps the bandage from sticking to the wound.  · Have your stitches or staples removed as told by your doctor.  If your doctor used skin adhesive strips:   · Keep the wound clean and dry.  · If you were given a bandage, you should change it at least one time per day or as told by your doctor. You should also change it if it gets dirty or wet.  · Do not get the skin adhesive strips wet. You can take a shower or a bath, but be careful to keep the wound dry.  · If the wound gets wet, pat it dry with a clean towel. Do not rub the wound.  · Skin adhesive strips fall off on their own. You can trim the strips as the wound heals. Do not remove any strips that are still stuck to the wound. They will  fall off after a while.  If your doctor used wound glue:  · Try to keep your wound dry, but you may briefly wet it in the shower or bath. Do not soak the wound in water, such as by swimming.  · After you take a shower or a bath, gently pat the wound dry with a clean towel. Do not rub the wound.  · Do not do any activities that will make you really sweaty until the skin glue has fallen off on its own.  · Do not apply liquid, cream, or ointment medicine to your wound while the skin glue is still on.  · If you were given a bandage, you should change it at least one time per day or as told by your doctor. You should also change it if it gets dirty or wet.  · If a bandage is placed over the wound, do not let the tape for the bandage touch the skin glue.  · Do not pick at the glue. The skin glue usually stays on for 5-10 days. Then, it   falls off of the skin.  General Instructions   · To help prevent scarring, make sure to cover your wound with sunscreen whenever you are outside after stitches are removed, after adhesive strips are removed, or when wound glue stays in place and the wound is healed. Make sure to wear a sunscreen of at least 30 SPF.  · Take over-the-counter and prescription medicines only as told by your doctor.  · If you were given antibiotic medicine or ointment, take or apply it as told by your doctor. Do not stop using the antibiotic even if your wound is getting better.  · Do not scratch or pick at the wound.  · Keep all follow-up visits as told by your doctor. This is important.  · Check your wound every day for signs of infection. Watch for:    Redness, swelling, or pain.    Fluid, blood, or pus.  · Raise (elevate) the injured area above the level of your heart while you are sitting or lying down, if possible.  GET HELP IF:  · You got a tetanus shot and you have any of these problems at the injection site:    Swelling.    Very bad pain.    Redness.    Bleeding.  · You have a fever.  · A wound that was  closed breaks open.  · You notice a bad smell coming from your wound or your bandage.  · You notice something coming out of the wound, such as wood or glass.  · Medicine does not help your pain.  · You have more redness, swelling, or pain at the site of your wound.  · You have fluid, blood, or pus coming from your wound.  · You notice a change in the color of your skin near your wound.  · You need to change the bandage often because fluid, blood, or pus is coming from the wound.  · You start to have a new rash.  · You start to have numbness around the wound.  GET HELP RIGHT AWAY IF:  · You have very bad swelling around the wound.  · Your pain suddenly gets worse and is very bad.  · You notice painful lumps near the wound or on skin that is anywhere on your body.  · You have a red streak going away from your wound.  · The wound is on your hand or foot and you cannot move a finger or toe like you usually can.  · The wound is on your hand or foot and you notice that your fingers or toes look pale or bluish.     This information is not intended to replace advice given to you by your health care provider. Make sure you discuss any questions you have with your health care provider.     Document Released: 07/29/2007 Document Revised: 06/26/2014 Document Reviewed: 02/05/2014  Elsevier Interactive Patient Education ©2016 [employer]

## 2015-09-07 NOTE — ED Notes (Signed)
See triage note. Pt cut arm on sheet metal this around noon at home. Presents with approx. 2.5-3in laceration to R upper arm.

## 2015-09-07 NOTE — ED Provider Notes (Signed)
[facility] Emergency Department Provider Note   ____________________________________________  Time seen: Approximately 1:51 PM  I have reviewed the triage vital signs and the nursing notes.   HISTORY  Chief Complaint Laceration    HPI Maurice Lucas is a 65 y.o. male patient's laceration secondary to a metal sheet cut to his upper right arm. Incident occurred approximately 2 hours ago. Bleeding is controlled with direct pressure. Patient denies loss sensation or loss of function other affected extremity. Patient is right-hand dominant. No other palliative measures taken for this complaint. Patient rates his pain as a 5/10.   Past Medical History  Diagnosis Date  . Chronic kidney disease     kidney stones  . Hyperlipidemia   . Hepatic hemangioma   . GERD (gastroesophageal reflux disease)   . Barrett esophagus   . Kidney stone   . History of hiatal hernia     There are no active problems to display for this patient.   Past Surgical History  Procedure Laterality Date  . Nose surgery    . Knee arthroscopy Right   . Colonoscopy    . Esophagogastroduodenoscopy    . Knee arthroscopy Left 08/07/2014  . Knee arthroscopy with medial menisectomy Left 08/07/2014    Procedure: KNEE ARTHROSCOPY WITH MEDIAL MENISECTOMY;  Surgeon: Hessie Knows, MD;  Location: [facility] ORS;  Service: Orthopedics;  Laterality: Left;  partial medial menisectomy, plica incsion  . Esophagogastroduodenoscopy (egd) with propofol N/A 11/02/2014    Procedure: ESOPHAGOGASTRODUODENOSCOPY (EGD) WITH PROPOFOL;  Surgeon: Lollie Sails, MD;  Location: [facility] ENDOSCOPY;  Service: Endoscopy;  Laterality: N/A;  . Open reduction internal fixation (orif) distal radial fracture Right 05/22/2015    Procedure: OPEN REDUCTION INTERNAL FIXATION (ORIF) DISTAL RADIAL FRACTURE;  Surgeon: Hessie Knows, MD;  Location: [facility] ORS;  Service: Orthopedics;  Laterality: Right;  . Irrigation and debridement knee Left  05/22/2015    Procedure: IRRIGATION AND DEBRIDEMENT KNEE WITH PATELLA TENDON REPAIR AND LACERATION REPAIR;  Surgeon: Hessie Knows, MD;  Location: [facility] ORS;  Service: Orthopedics;  Laterality: Left;  . Carpal tunnel release Right 05/22/2015    Procedure: CARPAL TUNNEL RELEASE;  Surgeon: Hessie Knows, MD;  Location: [facility] ORS;  Service: Orthopedics;  Laterality: Right;    Current Outpatient Rx  Name  Route  Sig  Dispense  Refill  . HYDROcodone-acetaminophen (NORCO) 5-325 MG tablet   Oral   Take 1-2 tablets by mouth every 6 (six) hours as needed for moderate pain.   30 tablet   0   . Omega-3 Fatty Acids (FISH OIL CONCENTRATE PO)   Oral   Take 1 capsule by mouth daily.         Marland Kitchen oxyCODONE-acetaminophen (ROXICET) 5-325 MG tablet   Oral   Take 1 tablet by mouth every 4 (four) hours as needed for severe pain.   30 tablet   0   . pantoprazole (PROTONIX) 40 MG tablet   Oral   Take 40 mg by mouth every morning.         . simvastatin (ZOCOR) 20 MG tablet   Oral   Take 20 mg by mouth at bedtime.           Allergies Review of patient's allergies indicates no known allergies.  Family History  Problem Relation Age of Onset  . Leukemia Mother   . Leukemia Sister     Social History Social History  Substance Use Topics  . Smoking status: Former Smoker    Types: Cigarettes  .  Smokeless tobacco: Former Systems developer    Types: Chew  . Alcohol Use: No    Review of Systems Constitutional: No fever/chills Eyes: No visual changes. ENT: No sore throat. Cardiovascular: Denies chest pain. Respiratory: Denies shortness of breath. Gastrointestinal: No abdominal pain.  No nausea, no vomiting.  No diarrhea.  No constipation. Genitourinary: Negative for dysuria. Musculoskeletal: Negative for back pain. Skin: Negative for rash. Neurological: Negative for headaches, focal weakness or numbness. Endocrine:Hypertension hyperlipidemia ____________________________________________   PHYSICAL  EXAM:  VITAL SIGNS: ED Triage Vitals  Enc Vitals Group     BP 09/07/15 1328 121/82 mmHg     Pulse Rate 09/07/15 1328 78     Resp 09/07/15 1328 18     Temp 09/07/15 1328 97.6 F (36.4 C)     Temp Source 09/07/15 1328 Oral     SpO2 09/07/15 1328 98 %     Weight 09/07/15 1328 182 lb (82.555 kg)     Height 09/07/15 1328 5\' 6"  (1.676 m)     Head Cir --      Peak Flow --      Pain Score 09/07/15 1329 5     Pain Loc --      Pain Edu? --      Excl. in [place]? --     Constitutional: Alert and oriented. Well appearing and in no acute distress. Eyes: Conjunctivae are normal. PERRL. EOMI. Head: Atraumatic. Nose: No congestion/rhinnorhea. Mouth/Throat: Mucous membranes are moist.  Oropharynx non-erythematous. Neck: No stridor.  No cervical spine tenderness to palpation. Hematological/Lymphatic/Immunilogical: No cervical lymphadenopathy. Cardiovascular: Normal rate, regular rhythm. Grossly normal heart sounds.  Good peripheral circulation. Respiratory: Normal respiratory effort.  No retractions. Lungs CTAB. Gastrointestinal: Soft and nontender. No distention. No abdominal bruits. No CVA tenderness. Musculoskeletal: No lower extremity tenderness nor edema.  No joint effusions. Neurologic:  Normal speech and language. No gross focal neurologic deficits are appreciated. No gait instability. Skin:  5 cm laceration anterior arm.  Psychiatric: Mood and affect are normal. Speech and behavior are normal.  ____________________________________________   LABS (all labs ordered are listed, but only abnormal results are displayed)  Labs Reviewed - No data to display ____________________________________________  EKG   ____________________________________________  RADIOLOGY   ____________________________________________   PROCEDURES  Procedure(s) performed: LACERATION REPAIR Performed by: Sable Feil Authorized by: Sable Feil Consent: Verbal consent obtained. Risks and benefits:  risks, benefits and alternatives were discussed Consent given by: patient Patient identity confirmed: provided demographic data Prepped and Draped in normal sterile fashion Wound explored  Laceration Location: Right upper arm  Laceration Length: 5 cm cm  No Foreign Bodies seen or palpated  Anesthesia: local infiltration  Local anesthetic: lidocaine 1% without epinephrine  Anesthetic total: 8 mL   Irrigation method: syringe Amount of cleaning: standard  Skin closure: 3-0 nylon Number of sutures: 11 Technique: Interrupted Patient tolerance: Patient tolerated the procedure well with no immediate complications.   Procedures  Critical Care performed: No  ____________________________________________   INITIAL IMPRESSION / ASSESSMENT AND PLAN / ED COURSE  Pertinent labs & imaging results that were available during my care of the patient were reviewed by me and considered in my medical decision making (see chart for details).  Right upper arm laceration. Patient given discharge care instructions. Patient given prescription for Percocet. Patient advised suture removal in 10 days by this department or his family doctor. ____________________________________________   FINAL CLINICAL IMPRESSION(S) / ED DIAGNOSES  Final diagnoses:  Arm laceration, right, initial encounter  NEW MEDICATIONS STARTED DURING THIS VISIT:  New Prescriptions   OXYCODONE-ACETAMINOPHEN (ROXICET) 5-325 MG TABLET    Take 1 tablet by mouth every 4 (four) hours as needed for severe pain.     Note:  This document was prepared using Dragon voice recognition software and may include unintentional dictation errors.    Sable Feil, PA-C 09/07/15 1447  Harvest Dark, MD 09/07/15 1451

## 2015-09-07 NOTE — ED Notes (Signed)
Cut upper arm on sheet metal about noon. Wrapped with towel with no bleed through noted.

## 2015-11-12 ENCOUNTER — Encounter: Payer: Self-pay | Admitting: Emergency Medicine

## 2015-11-12 ENCOUNTER — Ambulatory Visit (INDEPENDENT_AMBULATORY_CARE_PROVIDER_SITE_OTHER)
Admission: EM | Admit: 2015-11-12 | Discharge: 2015-11-12 | Disposition: A | Discharge status: Home / Self Care | Payer: 59 | Source: Home / Self Care | Attending: Family Medicine | Admitting: Family Medicine

## 2015-11-12 ENCOUNTER — Emergency Department: Payer: 59

## 2015-11-12 ENCOUNTER — Emergency Department
Admission: EM | Admit: 2015-11-12 | Discharge: 2015-11-12 | Disposition: A | Discharge status: Home / Self Care | Payer: 59 | Attending: Emergency Medicine | Admitting: Emergency Medicine

## 2015-11-12 DIAGNOSIS — J029 Acute pharyngitis, unspecified: Secondary | ICD-10-CM | POA: Diagnosis not present

## 2015-11-12 DIAGNOSIS — N189 Chronic kidney disease, unspecified: Secondary | ICD-10-CM | POA: Insufficient documentation

## 2015-11-12 DIAGNOSIS — Z87891 Personal history of nicotine dependence: Secondary | ICD-10-CM | POA: Insufficient documentation

## 2015-11-12 DIAGNOSIS — B349 Viral infection, unspecified: Secondary | ICD-10-CM

## 2015-11-12 LAB — CBC WITH DIFFERENTIAL/PLATELET
BASOS ABS: 0.1 10*3/uL (ref 0–0.1)
Basophils Relative: 1 %
EOS PCT: 1 %
Eosinophils Absolute: 0.2 10*3/uL (ref 0–0.7)
HCT: 44.6 % (ref 40.0–52.0)
HEMOGLOBIN: 15.4 g/dL (ref 13.0–18.0)
LYMPHS PCT: 12 %
Lymphs Abs: 1.4 10*3/uL (ref 1.0–3.6)
MCH: 31.9 pg (ref 26.0–34.0)
MCHC: 34.5 g/dL (ref 32.0–36.0)
MCV: 92.3 fL (ref 80.0–100.0)
Monocytes Absolute: 1 10*3/uL (ref 0.2–1.0)
Monocytes Relative: 8 %
NEUTROS PCT: 78 %
Neutro Abs: 9.6 10*3/uL — ABNORMAL HIGH (ref 1.4–6.5)
PLATELETS: 257 10*3/uL (ref 150–440)
RBC: 4.84 MIL/uL (ref 4.40–5.90)
RDW: 13.3 % (ref 11.5–14.5)
WBC: 12.3 10*3/uL — AB (ref 3.8–10.6)

## 2015-11-12 LAB — BASIC METABOLIC PANEL
ANION GAP: 7 (ref 5–15)
BUN: 10 mg/dL (ref 6–20)
CO2: 30 mmol/L (ref 22–32)
Calcium: 9.2 mg/dL (ref 8.9–10.3)
Chloride: 101 mmol/L (ref 101–111)
Creatinine, Ser: 1.17 mg/dL (ref 0.61–1.24)
Glucose, Bld: 116 mg/dL — ABNORMAL HIGH (ref 65–99)
POTASSIUM: 4.1 mmol/L (ref 3.5–5.1)
SODIUM: 138 mmol/L (ref 135–145)

## 2015-11-12 LAB — RAPID STREP SCREEN (MED CTR MEBANE ONLY): STREPTOCOCCUS, GROUP A SCREEN (DIRECT): NEGATIVE

## 2015-11-12 MED ORDER — AMOXICILLIN-POT CLAVULANATE 875-125 MG PO TABS: 1.0000 | ORAL_TABLET | Freq: Two times a day (BID) | ORAL | 0 refills | Status: DC

## 2015-11-12 MED ORDER — SODIUM CHLORIDE 0.9 % IV BOLUS (SEPSIS)
1000.0000 mL | Freq: Once | INTRAVENOUS | Status: AC
  Administered 2015-11-12: 1000 mL via INTRAVENOUS

## 2015-11-12 MED ORDER — DEXAMETHASONE 4 MG PO TABS: ORAL_TABLET | ORAL | 0 refills | Status: AC

## 2015-11-12 MED ORDER — FEXOFENADINE-PSEUDOEPHED ER 180-240 MG PO TB24: 1.0000 | ORAL_TABLET | Freq: Every day | ORAL | 0 refills | Status: AC

## 2015-11-12 MED ORDER — SODIUM CHLORIDE 0.9 % IV SOLN
3.0000 g | Freq: Once | INTRAVENOUS | Status: AC
  Administered 2015-11-12: 3 g via INTRAVENOUS
  Filled 2015-11-12: qty 3

## 2015-11-12 MED ORDER — DEXAMETHASONE SODIUM PHOSPHATE 10 MG/ML IJ SOLN
10.0000 mg | Freq: Once | INTRAMUSCULAR | Status: AC
  Administered 2015-11-12: 10 mg via INTRAVENOUS
  Filled 2015-11-12: qty 1

## 2015-11-12 MED ORDER — AZITHROMYCIN 250 MG PO TABS: ORAL_TABLET | ORAL | 0 refills | Status: DC

## 2015-11-12 MED ORDER — LIDOCAINE VISCOUS 2 % MT SOLN: 10.0000 mL | OROMUCOSAL | 0 refills | Status: DC | PRN

## 2015-11-12 NOTE — ED Triage Notes (Signed)
Pt states that his throat started hurting last night. Went to walk in clinic this morning and did not have strep throat.  Complains of trouble swallowing and feels like his throat is closing up.  Says he has had fever and chills x 1 day.

## 2015-11-12 NOTE — Discharge Instructions (Signed)
Soft diet and increase as tolerated.   May take Tylenol as needed for pain.

## 2015-11-12 NOTE — ED Provider Notes (Signed)
[facility]    CSN: [id number] Arrival date & time: 11/12/15  [id number]  First Provider Contact:  First MD Initiated Contact with Patient 11/12/15 [phone]        History   Chief Complaint Chief Complaint  Patient presents with  . Fever    HPI Maurice Lucas is a 65 y.o. male.   Patient is a 65 year old white male who comes in with sore throat started last night. States he was at work and started bothering him. States the sore throat is very bad he has difficulty swallowing and he has a lot of discomfort and pain with it. States he had a sore throat about 2 weeks ago which cleared up on its own. Now he has the sore throat which is extremely sore and pain more on his left than on his right side. The fever the last night was no temperature was actually taken be just felt warm. Past smoking history has history of Barrett's esophagitis but states that this under good control those family mental leukemia and lymphoma and another family member he used to smoke in the axial chew tobacco but he stopped both those habits no known drug allergies. He's had endoscopy multiple times arthroscopies nasal surgeries and some other orthopedic surgeries as well past   The history is provided by the patient.  Fever  Temp source:  Subjective Severity:  Moderate Progression:  Worsening Chronicity:  New Relieved by:  Nothing Worsened by:  Nothing Associated symptoms: congestion, cough and sore throat   Associated symptoms: no ear pain     Past Medical History:  Diagnosis Date  . Barrett esophagus   . Chronic kidney disease    kidney stones  . GERD (gastroesophageal reflux disease)   . Hepatic hemangioma   . History of hiatal hernia   . Hyperlipidemia   . Kidney stone     There are no active problems to display for this patient.   Past Surgical History:  Procedure Laterality Date  . CARPAL TUNNEL RELEASE Right 05/22/2015   Procedure: CARPAL TUNNEL RELEASE;  Surgeon: Hessie Knows, MD;   Location: [facility] ORS;  Service: Orthopedics;  Laterality: Right;  . COLONOSCOPY    . ESOPHAGOGASTRODUODENOSCOPY    . ESOPHAGOGASTRODUODENOSCOPY (EGD) WITH PROPOFOL N/A 11/02/2014   Procedure: ESOPHAGOGASTRODUODENOSCOPY (EGD) WITH PROPOFOL;  Surgeon: Lollie Sails, MD;  Location: [facility] ENDOSCOPY;  Service: Endoscopy;  Laterality: N/A;  . IRRIGATION AND DEBRIDEMENT KNEE Left 05/22/2015   Procedure: IRRIGATION AND DEBRIDEMENT KNEE WITH PATELLA TENDON REPAIR AND LACERATION REPAIR;  Surgeon: Hessie Knows, MD;  Location: [facility] ORS;  Service: Orthopedics;  Laterality: Left;  . KNEE ARTHROSCOPY Right   . KNEE ARTHROSCOPY Left 08/07/2014  . KNEE ARTHROSCOPY WITH MEDIAL MENISECTOMY Left 08/07/2014   Procedure: KNEE ARTHROSCOPY WITH MEDIAL MENISECTOMY;  Surgeon: Hessie Knows, MD;  Location: [facility] ORS;  Service: Orthopedics;  Laterality: Left;  partial medial menisectomy, plica incsion  . NOSE SURGERY    . OPEN REDUCTION INTERNAL FIXATION (ORIF) DISTAL RADIAL FRACTURE Right 05/22/2015   Procedure: OPEN REDUCTION INTERNAL FIXATION (ORIF) DISTAL RADIAL FRACTURE;  Surgeon: Hessie Knows, MD;  Location: [facility] ORS;  Service: Orthopedics;  Laterality: Right;       Home Medications    Prior to Admission medications   Medication Sig Start Date End Date Taking? Authorizing Provider  Omega-3 Fatty Acids (FISH OIL CONCENTRATE PO) Take 1 capsule by mouth daily.   Yes Historical Provider, MD  pantoprazole (PROTONIX) 40 MG tablet Take 40 mg by  mouth every morning.   Yes Historical Provider, MD  simvastatin (ZOCOR) 20 MG tablet Take 20 mg by mouth at bedtime.   Yes Historical Provider, MD  azithromycin (ZITHROMAX Z-PAK) 250 MG tablet Take 2 tablets first day and then 1 po a day for 4 days 11/14/15 11/23/15  Frederich Cha, MD  fexofenadine-pseudoephedrine (ALLEGRA-D ALLERGY & CONGESTION) 180-240 MG 24 hr tablet Take 1 tablet by mouth daily. 11/12/15   Frederich Cha, MD  HYDROcodone-acetaminophen (NORCO) 5-325 MG tablet Take 1-2  tablets by mouth every 6 (six) hours as needed for moderate pain. 05/22/15   Hessie Knows, MD  oxyCODONE-acetaminophen (ROXICET) 5-325 MG tablet Take 1 tablet by mouth every 4 (four) hours as needed for severe pain. 09/07/15   Sable Feil, PA-C    Family History Family History  Problem Relation Age of Onset  . Leukemia Mother   . Leukemia Sister     Social History Social History  Substance Use Topics  . Smoking status: Former Smoker    Types: Cigarettes  . Smokeless tobacco: Former Systems developer    Types: Chew  . Alcohol use No     Allergies   Review of patient's allergies indicates no known allergies.   Review of Systems Review of Systems  Constitutional: Positive for fever.  HENT: Positive for congestion, drooling and sore throat. Negative for ear pain.   Respiratory: Positive for cough.   Musculoskeletal: Negative.   Neurological: Negative.   All other systems reviewed and are negative.    Physical Exam Triage Vital Signs ED Triage Vitals  Enc Vitals Group     BP 11/12/15 0821 131/89     Pulse Rate 11/12/15 0821 68     Resp 11/12/15 0821 15     Temp 11/12/15 0821 98.2 F (36.8 C)     Temp Source 11/12/15 0821 Oral     SpO2 11/12/15 0821 98 %     Weight 11/12/15 0820 193 lb (87.5 kg)     Height 11/12/15 0820 5\' 6"  (1.676 m)     Head Circumference --      Peak Flow --      Pain Score 11/12/15 0823 9     Pain Loc --      Pain Edu? --      Excl. in [place]? --    No data found.   Updated Vital Signs BP 131/89 (BP Location: Left Arm)   Pulse 68   Temp 98.2 F (36.8 C) (Oral)   Resp 15   Ht 5\' 6"  (1.676 m)   Wt 193 lb (87.5 kg)   SpO2 98%   BMI 31.15 kg/m   Visual Acuity Right Eye Distance:   Left Eye Distance:   Bilateral Distance:    Right Eye Near:   Left Eye Near:    Bilateral Near:     Physical Exam  Constitutional: He is oriented to person, place, and time. He appears well-developed and well-nourished.  HENT:  Head: Normocephalic and  atraumatic.  Right Ear: Hearing, tympanic membrane, external ear and ear canal normal.  Left Ear: Hearing, tympanic membrane, external ear and ear canal normal.  Nose: Nose normal. No mucosal edema.  Mouth/Throat: Uvula is midline. No oral lesions. No dental caries. Posterior oropharyngeal erythema present.  Eyes: Pupils are equal, round, and reactive to light.  Neck: Normal range of motion. Neck supple.  Cardiovascular: Normal rate and regular rhythm.   Pulmonary/Chest: Effort normal.  Musculoskeletal: Normal range of motion.  Lymphadenopathy:  He has cervical adenopathy.  Neurological: He is alert and oriented to person, place, and time.  Skin: Skin is warm.  Psychiatric: His mood appears anxious.  Vitals reviewed.    UC Treatments / Results  Labs (all labs ordered are listed, but only abnormal results are displayed) Labs Reviewed  RAPID STREP SCREEN (NOT AT [facility])  CULTURE, GROUP A STREP [facility])    EKG  EKG Interpretation None       Radiology No results found.  Procedures Procedures (including critical care time)  Medications Ordered in UC Medications - No data to display   Initial Impression / Assessment and Plan / UC Course  I have reviewed the triage vital signs and the nursing notes.  Pertinent labs & imaging results that were available during my care of the patient were reviewed by me and considered in my medical decision making (see chart for details).  Clinical Course   Patient apparently has a viral illness recommend gargling with saltwater. Will give him a work note for today and tomorrow. If he is not better by Thursday I'll give him a prescription for Z-Pak the get filled patient fill the Z-Pak at least until Thursday because this does appear to be all viral illness   Final Clinical Impressions(s) / UC Diagnoses   Final diagnoses:  Viral illness  Acute pharyngitis, unspecified pharyngitis type    New Prescriptions Discharge Medication List  as of 11/12/2015  9:04 AM    START taking these medications   Details  azithromycin (ZITHROMAX Z-PAK) 250 MG tablet Take 2 tablets first day and then 1 po a day for 4 days, Normal    fexofenadine-pseudoephedrine (ALLEGRA-D ALLERGY & CONGESTION) 180-240 MG 24 hr tablet Take 1 tablet by mouth daily., Starting Tue 11/12/2015, Normal      Patient was given the news that this appears be a viral illness with strep test being negative and that he is not better by Thursday at least 2 more days then he can take a Z-Pak and gradient-echo Allegra-D. He is concerned that he cannot swallow he feels that something is obstructing his airway and his stump. He does have a history of Barrett's esophagitis this point in time I recommend they go to [facility] ED to be seen and evaluated and see if he needs to have endoscopy done see something is causing an obstruction in the blockage that is agreeable with him. Initially offered to place him on prednisone irritations throat since he feels is the blockage we'll go ahead and have the seen there. Discussed with charge nurse Myriam Jacobson about his case at [facility] ED     Note: This dictation was prepared with Dragon dictation along with smaller phrase technology. Any transcriptional errors that result from this process are unintentional.(   Frederich Cha, MD 11/12/15 [phone]

## 2015-11-12 NOTE — ED Triage Notes (Signed)
Patient states that he started having a extreme sore throat while at work last night. Patient states that neck has been swollen and he has hardly been able to swallow.

## 2015-11-12 NOTE — ED Notes (Signed)
Pt concerned that he can't swallow due to soreness. Hx of Barretts esophagus. Dr Alveta Heimlich spoke with pt and relative regarding his concerns. Dr Alveta Heimlich advised pt to proceed to [facility] ED for further evaluation. Pt understood and agreed.

## 2015-11-12 NOTE — ED Provider Notes (Signed)
[facility] Emergency Department Provider Note  ____________________________________________  Time seen: Approximately 12:50 PM  I have reviewed the triage vital signs and the nursing notes.   HISTORY  Chief Complaint Sore Throat    HPI Maurice Lucas is a 65 y.o. male , NAD, presents to the emergency department, accompanied by his wife, with 2 day history of sore throat. States he had a sore throat approximately a week to week and a half ago that seemed to resolve on its own. Yesterday had sore throat beginning that was worse. Went to a local urgent care this morning and was told that his illness was viral in nature. Had a negative rapid strep test and was given a prescription for Allegra-D. Patient was notified that if his symptoms had not improved in a couple of days that they would provide him with a Z-Pak. Prior to leaving the urgent care he notified the provider that he had a sensation of a foreign body and saliva being stuck in his throat. He was advised to come to the emergency department for further evaluation. His wife states that they instead went to the pharmacy to pick up the Allegra-D, arrived home in which the patient took the pill and it got stuck in his throat. He states by the time they arrived to the emergency department he felt that the pill passed into his stomach. Continues to have significantly sore throat mainly on the left side. Has not had any fevers, chills, body aches. Denies any chest pain, shortness of breath, numbness, weakness, tingling. No headaches or visual changes. No abdominal pain, nausea, vomiting. Has not noted any rashes, redness, abnormal warmth or swelling. No sinus congestion or ear pain.   Past Medical History:  Diagnosis Date  . Barrett esophagus   . Chronic kidney disease    kidney stones  . GERD (gastroesophageal reflux disease)   . Hepatic hemangioma   . History of hiatal hernia   . Hyperlipidemia   . Kidney stone      There are no active problems to display for this patient.   Past Surgical History:  Procedure Laterality Date  . CARPAL TUNNEL RELEASE Right 05/22/2015   Procedure: CARPAL TUNNEL RELEASE;  Surgeon: Hessie Knows, MD;  Location: [facility] ORS;  Service: Orthopedics;  Laterality: Right;  . COLONOSCOPY    . ESOPHAGOGASTRODUODENOSCOPY    . ESOPHAGOGASTRODUODENOSCOPY (EGD) WITH PROPOFOL N/A 11/02/2014   Procedure: ESOPHAGOGASTRODUODENOSCOPY (EGD) WITH PROPOFOL;  Surgeon: Lollie Sails, MD;  Location: [facility] ENDOSCOPY;  Service: Endoscopy;  Laterality: N/A;  . IRRIGATION AND DEBRIDEMENT KNEE Left 05/22/2015   Procedure: IRRIGATION AND DEBRIDEMENT KNEE WITH PATELLA TENDON REPAIR AND LACERATION REPAIR;  Surgeon: Hessie Knows, MD;  Location: [facility] ORS;  Service: Orthopedics;  Laterality: Left;  . KNEE ARTHROSCOPY Right   . KNEE ARTHROSCOPY Left 08/07/2014  . KNEE ARTHROSCOPY WITH MEDIAL MENISECTOMY Left 08/07/2014   Procedure: KNEE ARTHROSCOPY WITH MEDIAL MENISECTOMY;  Surgeon: Hessie Knows, MD;  Location: [facility] ORS;  Service: Orthopedics;  Laterality: Left;  partial medial menisectomy, plica incsion  . NOSE SURGERY    . OPEN REDUCTION INTERNAL FIXATION (ORIF) DISTAL RADIAL FRACTURE Right 05/22/2015   Procedure: OPEN REDUCTION INTERNAL FIXATION (ORIF) DISTAL RADIAL FRACTURE;  Surgeon: Hessie Knows, MD;  Location: [facility] ORS;  Service: Orthopedics;  Laterality: Right;    Prior to Admission medications   Medication Sig Start Date End Date Taking? Authorizing Provider  amoxicillin-clavulanate (AUGMENTIN) 875-125 MG tablet Take 1 tablet by mouth 2 (two) times  daily. 11/12/15   Jami L Hagler, PA-C  dexamethasone (DECADRON) 4 MG tablet Take 6 tablets on Day 1 with food, then decrease by 1 tablet daily until finished (6,5,4,3,2,1) 11/12/15   Jami L Hagler, PA-C  fexofenadine-pseudoephedrine (ALLEGRA-D ALLERGY & CONGESTION) 180-240 MG 24 hr tablet Take 1 tablet by mouth daily. 11/12/15   Frederich Cha, MD   HYDROcodone-acetaminophen (NORCO) 5-325 MG tablet Take 1-2 tablets by mouth every 6 (six) hours as needed for moderate pain. 05/22/15   Hessie Knows, MD  lidocaine (XYLOCAINE) 2 % solution Use as directed 10 mLs in the mouth or throat every 4 (four) hours as needed for mouth pain. 11/12/15   Jami L Hagler, PA-C  Omega-3 Fatty Acids (FISH OIL CONCENTRATE PO) Take 1 capsule by mouth daily.    Historical Provider, MD  oxyCODONE-acetaminophen (ROXICET) 5-325 MG tablet Take 1 tablet by mouth every 4 (four) hours as needed for severe pain. 09/07/15   Sable Feil, PA-C  pantoprazole (PROTONIX) 40 MG tablet Take 40 mg by mouth every morning.    Historical Provider, MD  simvastatin (ZOCOR) 20 MG tablet Take 20 mg by mouth at bedtime.    Historical Provider, MD    Allergies Review of patient's allergies indicates no known allergies.  Family History  Problem Relation Age of Onset  . Leukemia Mother   . Leukemia Sister     Social History Social History  Substance Use Topics  . Smoking status: Former Smoker    Types: Cigarettes  . Smokeless tobacco: Former Systems developer    Types: Chew  . Alcohol use No     Review of Systems  Constitutional: No fever/chills Eyes: No visual changes. ENT: Positive sore throat, significant pain with swallowing, foreign body sensation that is intermittent. No ear pain, sinus congestion. Cardiovascular: No chest pain. Respiratory: No cough, chest congestion. No shortness of breath. No wheezing.  Gastrointestinal: No abdominal pain.  No nausea, vomiting.   Musculoskeletal: Negative for neck pain, general myalgias.  Skin: Negative for rash, redness, swelling, abnormal warmth, skin sores. Neurological: Negative for headaches, focal weakness or numbness. No tingling. 10-point ROS otherwise negative.  ____________________________________________   PHYSICAL EXAM:  VITAL SIGNS: ED Triage Vitals  Enc Vitals Group     BP 11/12/15 1059 130/77     Pulse Rate 11/12/15  1059 64     Resp 11/12/15 1059 16     Temp 11/12/15 1059 97.7 F (36.5 C)     Temp Source 11/12/15 1059 Oral     SpO2 11/12/15 1059 98 %     Weight 11/12/15 1100 195 lb (88.5 kg)     Height 11/12/15 1100 5\' 6"  (1.676 m)     Head Circumference --      Peak Flow --      Pain Score 11/12/15 1100 9     Pain Loc --      Pain Edu? --      Excl. in [place]? --      Constitutional: Alert and oriented. Well appearing and in no acute distress, but grimaces when he swallows. No "hot potato" voice. Eyes: Conjunctivae are normal. PERRL. EOMI without pain.  Head: Atraumatic. ENT:      Ears: TMs visualized bilaterally without erythema, effusion, bulging.      Nose: No congestion/rhinnorhea.      Mouth/Throat: Mucous membranes are moist. Patient able to swallow secretions but with pain. Pharynx with mild erythema but no swelling or exudates. Uvula is midline Neck: No stridor, carotid  bruits. No cervical spine tenderness to palpation. Supple with full range of motion. No meningismus. Trachea is midline. Hematological/Lymphatic/Immunilogical: No cervical lymphadenopathy but patient notes significant tenderness to palpation of the anterior cervical chain. Cardiovascular: Normal rate, regular rhythm. Normal S1 and S2.  Good peripheral circulation. Respiratory: Normal respiratory effort without tachypnea or retractions. Lungs CTAB with breath sounds noted in all lung fields. No wheeze, rhonchi, rales Neurologic:  Normal speech and language. No gross focal neurologic deficits are appreciated.  Skin:  Skin is warm, dry and intact. No rash noted. Psychiatric: Mood and affect are normal. Speech and behavior are normal. Patient exhibits appropriate insight and judgement.   ____________________________________________   LABS (all labs ordered are listed, but only abnormal results are displayed)  Labs Reviewed  BASIC METABOLIC PANEL  CBC WITH DIFFERENTIAL/PLATELET    ____________________________________________  EKG  None ____________________________________________  RADIOLOGY I have personally viewed and evaluated these images (plain radiographs) as part of my medical decision making, as well as reviewing the written report by the radiologist.  Dg Neck Soft Tissue  Result Date: 11/12/2015 CLINICAL DATA:  Choking sensation. EXAM: NECK SOFT TISSUES - 1+ VIEW COMPARISON:  CT 05/22/2015. FINDINGS: Diffuse multilevel degenerative change. Degenerative change particularly prominent C6-C7 with prominent disc space loss and endplate osteophytes. No acute bony abnormality identified. Diffuse osteopenia. Pulmonary apices are clear. IMPRESSION: Diffuse multilevel degenerative change with degenerative changes particularly prominent C6-C7. Diffuse osteopenia. No acute abnormality. Electronically Signed   By: Marcello Moores  Register   On: 11/12/2015 11:45    ____________________________________________    PROCEDURES  Procedure(s) performed: None   Procedures   Medications  dexamethasone (DECADRON) injection 10 mg (10 mg Intravenous Given 11/12/15 1204)  Ampicillin-Sulbactam (UNASYN) 3 g in sodium chloride 0.9 % 100 mL IVPB (0 g Intravenous Stopped 11/12/15 1342)  sodium chloride 0.9 % bolus 1,000 mL (0 mLs Intravenous Stopped 11/12/15 1401)     ____________________________________________   INITIAL IMPRESSION / ASSESSMENT AND PLAN / ED COURSE  Pertinent labs & imaging results that were available during my care of the patient were reviewed by me and considered in my medical decision making (see chart for details).  Clinical Course  Comment By Time  CBC returned with a mildly elevated white count of 12. Considering the patient's symptoms and history of previous resolved sore throat that is now resumed that is worse, we'll treat him with IV Unasyn. He has tolerated IV Decadron well and notes no foreign body sensation at this time. Braxton Feathers, PA-C 09/19  1310  Water swallowing challenge was completed and the patient is able to swallow liquids without adverse events. States he continues to have sore throat but he is not having a choking sensation when he swallows liquid. Braxton Feathers, PA-C 09/19 1350    Patient's diagnosis is consistent with Acute pharyngitis. Patient will be discharged home with prescriptions for Augmentin, Decadron Dosepak and lidocaine viscous to use as directed. May take over-the-counter Tylenol as needed for pain. Patient is to follow up with Dr. Beverly Gust in ENT in 48 hours for recheck. Patient is given ED precautions to return to the ED for any worsening or new symptoms.    ____________________________________________  FINAL CLINICAL IMPRESSION(S) / ED DIAGNOSES  Final diagnoses:  Acute pharyngitis, unspecified pharyngitis type      NEW MEDICATIONS STARTED DURING THIS VISIT:  Discharge Medication List as of 11/12/2015  1:59 PM    START taking these medications   Details  amoxicillin-clavulanate (AUGMENTIN) 875-125 MG tablet  Take 1 tablet by mouth 2 (two) times daily., Starting Tue 11/12/2015, Print    dexamethasone (DECADRON) 4 MG tablet Take 6 tablets on Day 1 with food, then decrease by 1 tablet daily until finished (6,5,4,3,2,1), Print    lidocaine (XYLOCAINE) 2 % solution Use as directed 10 mLs in the mouth or throat every 4 (four) hours as needed for mouth pain., Starting Tue 11/12/2015, Print            Judithe Modest Oriental, PA-C 11/12/15 1409    Delman Kitten, MD 11/12/15 [phone]

## 2015-11-14 LAB — CULTURE, GROUP A STREP (THRC)

## 2016-03-02 DIAGNOSIS — Z23 Encounter for immunization: Secondary | ICD-10-CM | POA: Diagnosis not present

## 2016-05-12 DIAGNOSIS — K2270 Barrett's esophagus without dysplasia: Secondary | ICD-10-CM | POA: Diagnosis not present

## 2016-05-12 DIAGNOSIS — Z86010 Personal history of colonic polyps: Secondary | ICD-10-CM | POA: Diagnosis not present

## 2016-05-20 DIAGNOSIS — E7800 Pure hypercholesterolemia, unspecified: Secondary | ICD-10-CM | POA: Diagnosis not present

## 2016-05-20 DIAGNOSIS — K219 Gastro-esophageal reflux disease without esophagitis: Secondary | ICD-10-CM | POA: Diagnosis not present

## 2016-06-01 DIAGNOSIS — R748 Abnormal levels of other serum enzymes: Secondary | ICD-10-CM | POA: Diagnosis not present

## 2016-07-19 IMAGING — CT CT CERVICAL SPINE W/O CM
3 of 5 series · 9 of 33 positions shown, 10 images · non-contrast
Comparison: Head and cervical spine CT 12/26/2004.

CLINICAL DATA: 64-year-old male who fell off roof this morning
while cleaning gutters. Left ear contusion. Initial encounter.

EXAM:
CT HEAD WITHOUT CONTRAST
CT CERVICAL SPINE WITHOUT CONTRAST
TECHNIQUE: Multidetector CT imaging of the head and cervical spine was
performed following the standard protocol without intravenous
contrast. Multiplanar CT image reconstructions of the cervical spine
were also generated.

[Series 8: sagittal bone · sagittal · 0.24mm/px · 5 of 53 slices shown]
[im 18/53  bone]
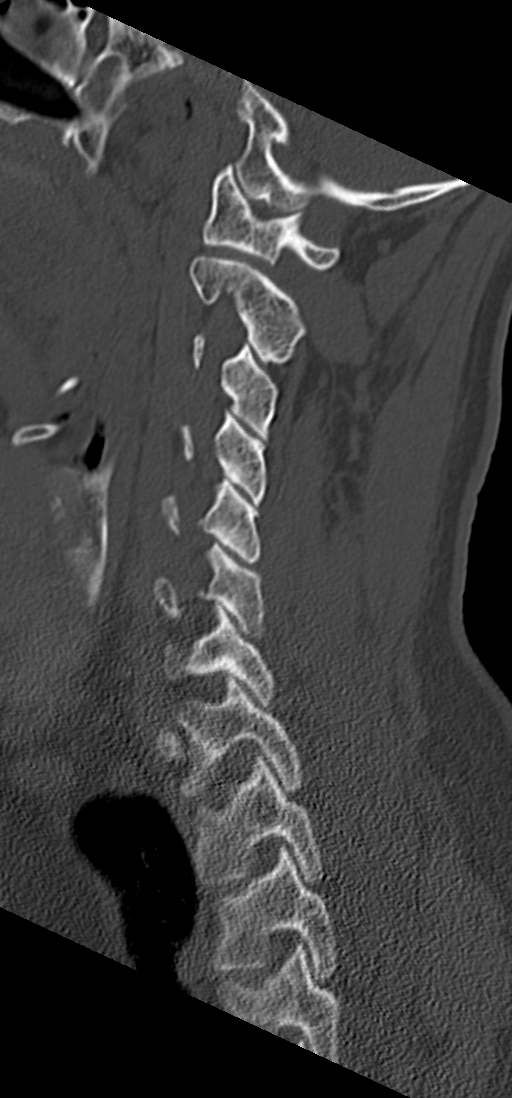
[im 22/53  bone]
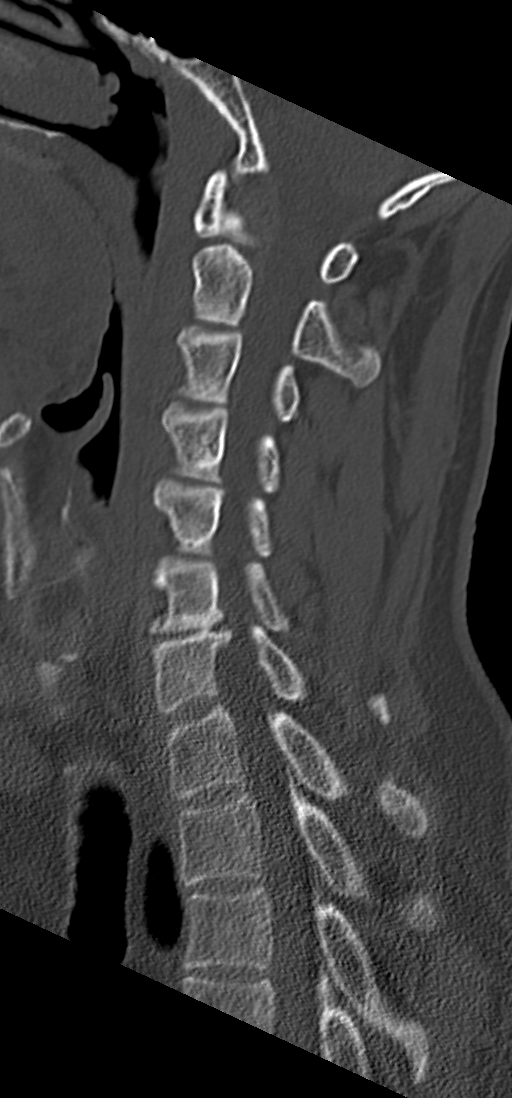
[im 27/53  bone]
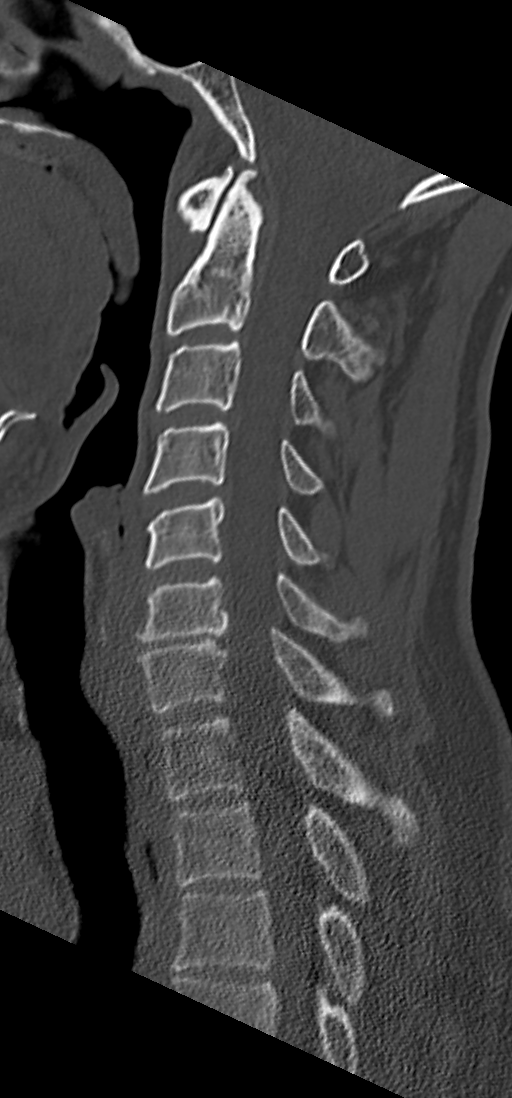
[im 31/53  bone]
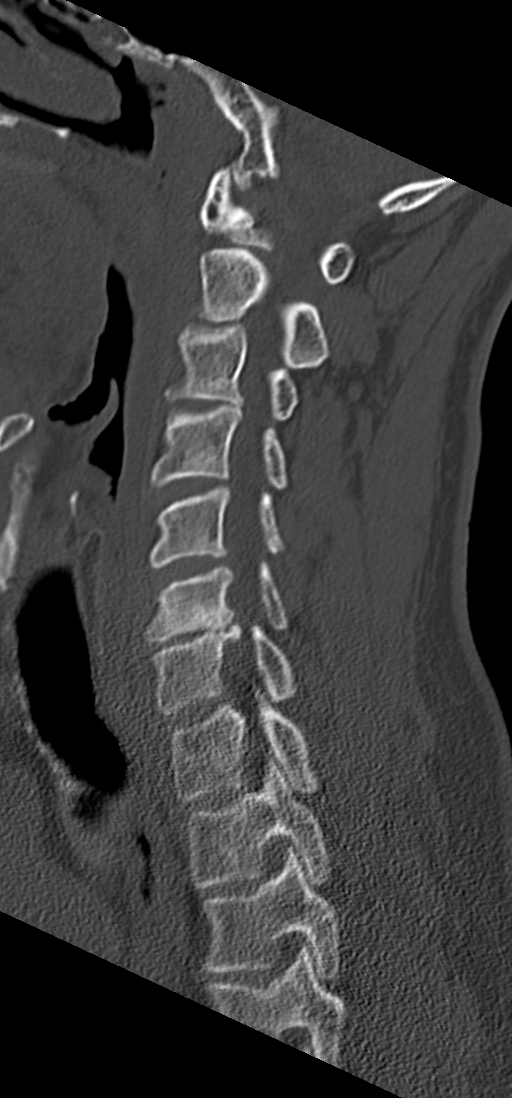
[im 35/53  bone]
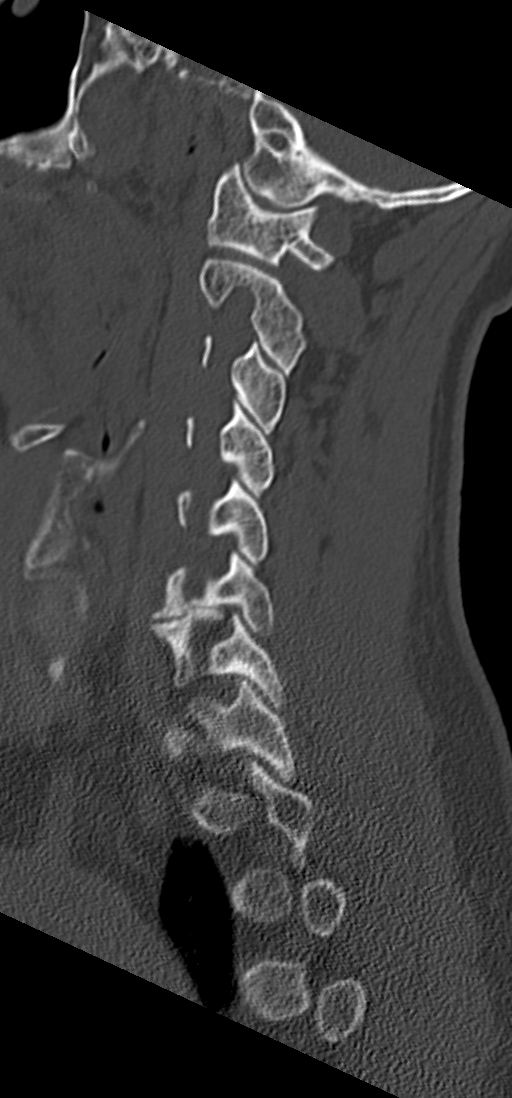

[Series 9: coronal bone · coronal · 0.27mm/px · 3 of 62 slices shown]
[im 16/62  bone]
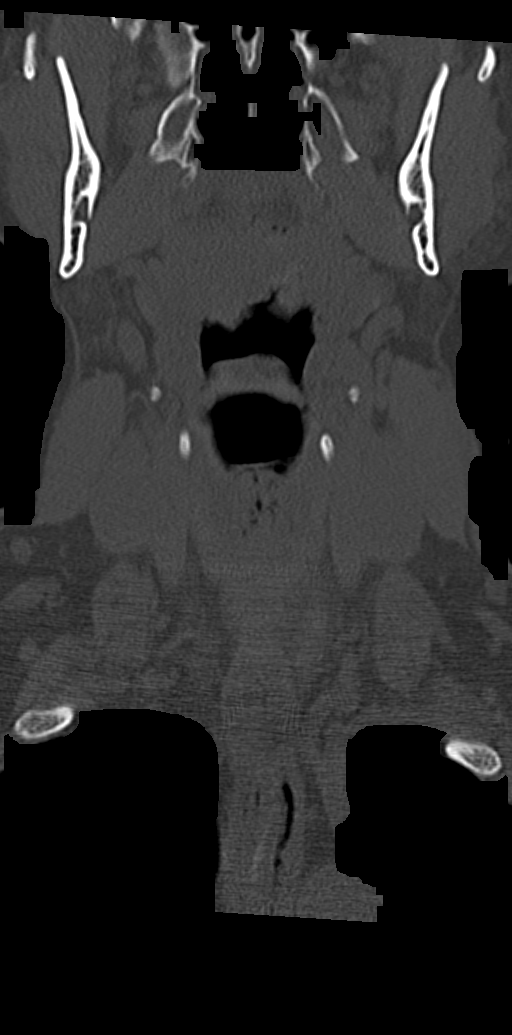
[im 26/62  bone]
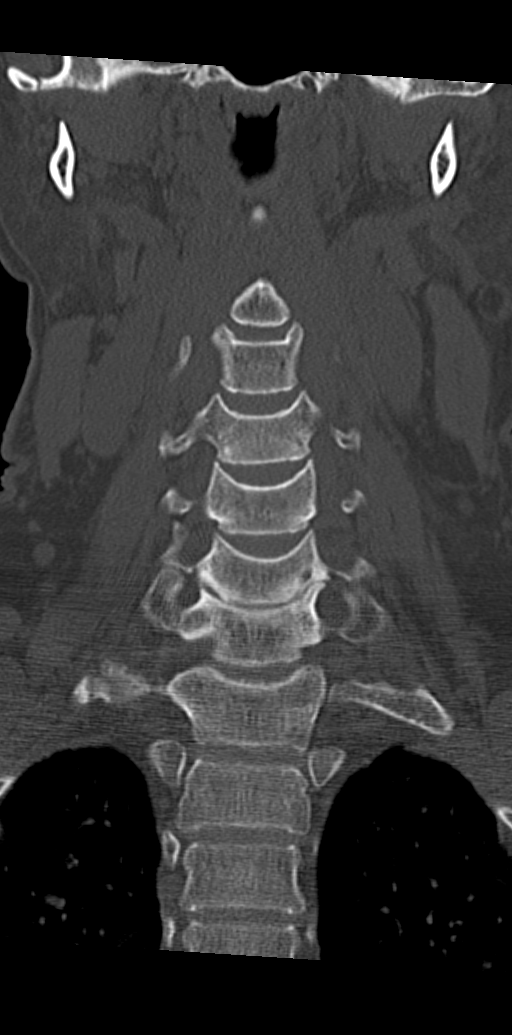
[im 36/62  bone]
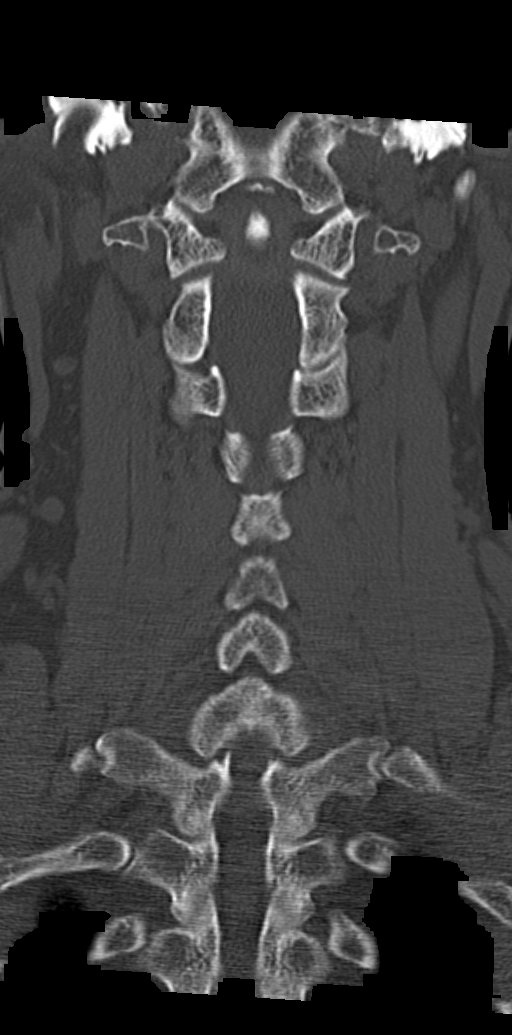

[Series 10: axial · axial · 0.23mm/px · z∈[+1231,+1231]mm · 1 of 129 slices shown, 2 images]
[im 65/129  soft-tissue]
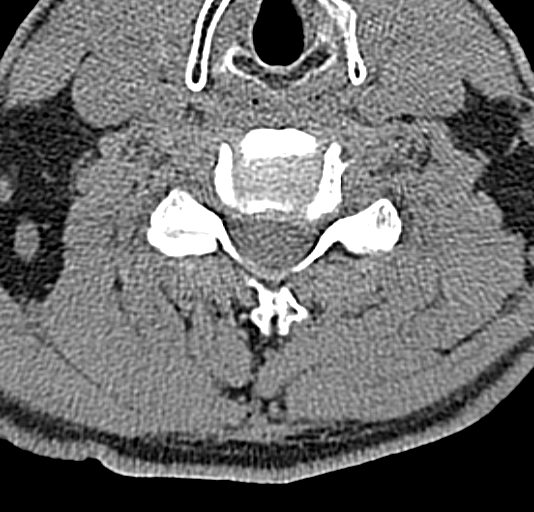
[im 65/129  bone]
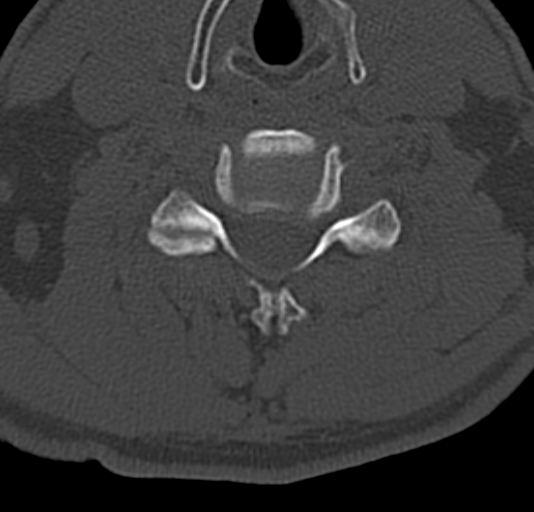

[9 of 33 positions shown; findings below may reference images not displayed]

FINDINGS: CT HEAD FINDINGS

Visualized paranasal sinuses and mastoids are clear. No scalp
hematoma identified. No calvarium fracture identified.

Cerebral volume has mildly decreased since 8551 but remains normal
for age. No midline shift, ventriculomegaly, mass effect, evidence
of mass lesion, intracranial hemorrhage or evidence of cortically
based acute infarction. Gray-white matter differentiation is within
normal limits throughout the brain.

CT CERVICAL SPINE FINDINGS

Visualized skull base is intact. No atlanto-occipital dissociation.
Cervicothoracic junction alignment is within normal limits.
Bilateral posterior element alignment is within normal limits.
Increased straightening of cervical lordosis today compared to 8551.
No acute cervical spine fracture identified. Visible upper thoracic
levels appear intact.

Chronic and progressed lower cervical disc and endplate degeneration
at C6-C7. Degenerative moderate to severe C7 foraminal stenosis.

Negative lung apices. Negative noncontrast superior mediastinum.
Negative noncontrast paraspinal soft tissues. Partially visible
opacification of the right maxillary sinus appears inflammatory in
nature.
IMPRESSION: 1.  Normal noncontrast CT appearance of the brain.
2. No acute fracture or listhesis identified in the cervical spine.
Ligamentous injury is not excluded.

## 2016-10-05 ENCOUNTER — Ambulatory Visit
Admission: RE | Admit: 2016-10-05 | Discharge: 2016-10-05 | Disposition: A | Discharge status: Home / Self Care | Payer: Commercial Managed Care - HMO | Source: Ambulatory Visit | Attending: Gastroenterology | Admitting: Gastroenterology

## 2016-10-05 ENCOUNTER — Ambulatory Visit: Payer: Commercial Managed Care - HMO | Admitting: Certified Registered"

## 2016-10-05 ENCOUNTER — Encounter
Admission: RE | Disposition: A | Discharge status: Home / Self Care | Payer: Self-pay | Source: Ambulatory Visit | Attending: Gastroenterology

## 2016-10-05 DIAGNOSIS — Z87891 Personal history of nicotine dependence: Secondary | ICD-10-CM | POA: Insufficient documentation

## 2016-10-05 DIAGNOSIS — N189 Chronic kidney disease, unspecified: Secondary | ICD-10-CM | POA: Insufficient documentation

## 2016-10-05 DIAGNOSIS — Z8719 Personal history of other diseases of the digestive system: Secondary | ICD-10-CM | POA: Diagnosis present

## 2016-10-05 DIAGNOSIS — K210 Gastro-esophageal reflux disease with esophagitis: Secondary | ICD-10-CM | POA: Diagnosis not present

## 2016-10-05 DIAGNOSIS — E785 Hyperlipidemia, unspecified: Secondary | ICD-10-CM | POA: Insufficient documentation

## 2016-10-05 DIAGNOSIS — Z86010 Personal history of colonic polyps: Secondary | ICD-10-CM | POA: Diagnosis not present

## 2016-10-05 DIAGNOSIS — Z79899 Other long term (current) drug therapy: Secondary | ICD-10-CM | POA: Diagnosis not present

## 2016-10-05 DIAGNOSIS — Z87442 Personal history of urinary calculi: Secondary | ICD-10-CM | POA: Diagnosis not present

## 2016-10-05 DIAGNOSIS — K449 Diaphragmatic hernia without obstruction or gangrene: Secondary | ICD-10-CM | POA: Diagnosis not present

## 2016-10-05 DIAGNOSIS — K648 Other hemorrhoids: Secondary | ICD-10-CM | POA: Diagnosis not present

## 2016-10-05 DIAGNOSIS — K635 Polyp of colon: Secondary | ICD-10-CM | POA: Diagnosis not present

## 2016-10-05 DIAGNOSIS — Z1211 Encounter for screening for malignant neoplasm of colon: Secondary | ICD-10-CM | POA: Diagnosis not present

## 2016-10-05 DIAGNOSIS — K2270 Barrett's esophagus without dysplasia: Secondary | ICD-10-CM | POA: Diagnosis not present

## 2016-10-05 DIAGNOSIS — D122 Benign neoplasm of ascending colon: Secondary | ICD-10-CM | POA: Insufficient documentation

## 2016-10-05 DIAGNOSIS — K640 First degree hemorrhoids: Secondary | ICD-10-CM | POA: Insufficient documentation

## 2016-10-05 DIAGNOSIS — K621 Rectal polyp: Secondary | ICD-10-CM | POA: Diagnosis not present

## 2016-10-05 HISTORY — PX: ESOPHAGOGASTRODUODENOSCOPY (EGD) WITH PROPOFOL: SHX5813

## 2016-10-05 HISTORY — PX: COLONOSCOPY WITH PROPOFOL: SHX5780

## 2016-10-05 SURGERY — COLONOSCOPY WITH PROPOFOL
Anesthesia: General

## 2016-10-05 MED ORDER — GLYCOPYRROLATE 0.2 MG/ML IJ SOLN
INTRAMUSCULAR | Status: AC
  Filled 2016-10-05: qty 1

## 2016-10-05 MED ORDER — MIDAZOLAM HCL 2 MG/2ML IJ SOLN
INTRAMUSCULAR | Status: DC | PRN
  Administered 2016-10-05: 2 mg via INTRAVENOUS

## 2016-10-05 MED ORDER — PROPOFOL 500 MG/50ML IV EMUL
INTRAVENOUS | Status: AC
Start: 2016-10-05 — End: 2016-10-05
  Filled 2016-10-05: qty 50

## 2016-10-05 MED ORDER — LIDOCAINE HCL (PF) 2 % IJ SOLN
INTRAMUSCULAR | Status: AC
  Filled 2016-10-05: qty 2

## 2016-10-05 MED ORDER — LIDOCAINE HCL (CARDIAC) 20 MG/ML IV SOLN
INTRAVENOUS | Status: DC | PRN
Start: 2016-10-05 — End: 2016-10-05
  Administered 2016-10-05: 50 mg via INTRAVENOUS

## 2016-10-05 MED ORDER — MIDAZOLAM HCL 2 MG/2ML IJ SOLN
INTRAMUSCULAR | Status: AC
  Filled 2016-10-05: qty 2

## 2016-10-05 MED ORDER — SODIUM CHLORIDE 0.9 % IV SOLN
INTRAVENOUS | Status: DC
  Administered 2016-10-05: 1000 mL via INTRAVENOUS

## 2016-10-05 MED ORDER — PROPOFOL 10 MG/ML IV BOLUS
INTRAVENOUS | Status: DC | PRN
  Administered 2016-10-05: 70 mg via INTRAVENOUS

## 2016-10-05 MED ORDER — PROPOFOL 10 MG/ML IV BOLUS
INTRAVENOUS | Status: AC
  Filled 2016-10-05: qty 20

## 2016-10-05 MED ORDER — SODIUM CHLORIDE 0.9 % IV SOLN: INTRAVENOUS | Status: DC

## 2016-10-05 MED ORDER — GLYCOPYRROLATE 0.2 MG/ML IJ SOLN
INTRAMUSCULAR | Status: DC | PRN
Start: 2016-10-05 — End: 2016-10-05
  Administered 2016-10-05: 0.2 mg via INTRAVENOUS

## 2016-10-05 MED ORDER — PROPOFOL 500 MG/50ML IV EMUL
INTRAVENOUS | Status: DC | PRN
  Administered 2016-10-05: 150 ug/kg/min via INTRAVENOUS

## 2016-10-05 NOTE — Anesthesia Procedure Notes (Signed)
Performed by: Carston Riedl Pre-anesthesia Checklist: Patient identified, Emergency Drugs available, Suction available, Patient being monitored and Timeout performed Patient Re-evaluated:Patient Re-evaluated prior to induction Oxygen Delivery Method: Nasal cannula Preoxygenation: Pre-oxygenation with 100% oxygen Induction Type: IV induction       

## 2016-10-05 NOTE — H&P (Signed)
Outpatient short stay form Pre-procedure 10/05/2016 8:39 AM Maurice Sails MD  Primary Physician: Dr. Thereasa Distance  Reason for visit:  EGD and colonoscopy  History of present illness:  Patient is a 66 year old male presenting today as above. He has personal history of Barrett's esophagus for which were doing a follow-up evaluation today. He also has a personal history of adenomatous colon polyps. He has a history of dysphagia and has been dilated twice. However he states currently that he has no dysphagia symptoms. He is doing quite well. He does not regurgitate foods any longer. He is continuing on a proton pump inhibitor, Protonix 40 mg daily. He tolerated his prep well. He takes no aspirin or blood thinning agents.    Current Facility-Administered Medications:  .  0.9 %  sodium chloride infusion, , Intravenous, Continuous, Maurice Sails, MD, Last Rate: 20 mL/hr at 10/05/16 0826, 1,000 mL at 10/05/16 0826 .  0.9 %  sodium chloride infusion, , Intravenous, Continuous, Maurice Sails, MD  Prescriptions Prior to Admission  Medication Sig Dispense Refill Last Dose  . fexofenadine-pseudoephedrine (ALLEGRA-D ALLERGY & CONGESTION) 180-240 MG 24 hr tablet Take 1 tablet by mouth daily. 30 tablet 0 Past Week at Unknown time  . Omega-3 Fatty Acids (FISH OIL CONCENTRATE PO) Take 1 capsule by mouth daily.   Past Week at Unknown time  . oxyCODONE-acetaminophen (ROXICET) 5-325 MG tablet Take 1 tablet by mouth every 4 (four) hours as needed for severe pain. 30 tablet 0 Past Month at Unknown time  . pantoprazole (PROTONIX) 40 MG tablet Take 40 mg by mouth every morning.   10/04/2016 at Unknown time  . simvastatin (ZOCOR) 20 MG tablet Take 20 mg by mouth at bedtime.   10/04/2016 at Unknown time  . amoxicillin-clavulanate (AUGMENTIN) 875-125 MG tablet Take 1 tablet by mouth 2 (two) times daily. (Patient not taking: Reported on 10/05/2016) 20 tablet 0 Completed Course at Unknown time  .  dexamethasone (DECADRON) 4 MG tablet Take 6 tablets on Day 1 with food, then decrease by 1 tablet daily until finished (6,5,4,3,2,1) (Patient not taking: Reported on 10/05/2016) 21 tablet 0 Completed Course at Unknown time  . HYDROcodone-acetaminophen (NORCO) 5-325 MG tablet Take 1-2 tablets by mouth every 6 (six) hours as needed for moderate pain. (Patient not taking: Reported on 10/05/2016) 30 tablet 0 Completed Course at Unknown time  . lidocaine (XYLOCAINE) 2 % solution Use as directed 10 mLs in the mouth or throat every 4 (four) hours as needed for mouth pain. (Patient not taking: Reported on 10/05/2016) 100 mL 0 Completed Course at Unknown time     No Known Allergies   Past Medical History:  Diagnosis Date  . Barrett esophagus   . Chronic kidney disease    kidney stones  . GERD (gastroesophageal reflux disease)   . Hepatic hemangioma   . History of hiatal hernia   . Hyperlipidemia   . Kidney stone     Review of systems:      Physical Exam    Heart and lungs: Regular rate and rhythm without rub or gallop, lungs are bilaterally clear.    HEENT: Normocephalic atraumatic eyes are anicteric    Other:     Pertinant exam for procedure: Soft nontender nondistended bowel sounds positive normoactive.    Planned proceedures: EGD, colonoscopy and indicated procedures. I have discussed the risks benefits and complications of procedures to include not limited to bleeding, infection, perforation and the risk of sedation and the patient wishes  to proceed.    Maurice Sails, MD Gastroenterology 10/05/2016  8:39 AM

## 2016-10-05 NOTE — Op Note (Signed)
[facility] Gastroenterology Patient Name: Maurice Lucas Procedure Date: 10/05/2016 8:39 AM MRN: [id number] Account #: [id number] Date of Birth: 04/29/1950 Admit Type: Outpatient Age: 66 Room: [facility] ENDO ROOM 1 Gender: Male Note Status: Finalized Procedure:            Colonoscopy Indications:          Personal history of colonic polyps Providers:            Lollie Sails, MD Referring MD:         Sofie Hartigan (Referring MD) Medicines:            Monitored Anesthesia Care Complications:        No immediate complications. Procedure:            Pre-Anesthesia Assessment:                       - ASA Grade Assessment: II - A patient with mild                        systemic disease.                       After obtaining informed consent, the colonoscope was                        passed under direct vision. Throughout the procedure,                        the patient's blood pressure, pulse, and oxygen                        saturations were monitored continuously. The                        Colonoscope was introduced through the anus and                        advanced to the the cecum, identified by appendiceal                        orifice and ileocecal valve. The colonoscopy was                        performed without difficulty. The patient tolerated the                        procedure well. The quality of the bowel preparation                        was fair. Findings:      A 1 mm polyp was found in the rectum. The polyp was sessile. The polyp       was removed with a cold biopsy forceps. Resection and retrieval were       complete.      A 3 mm polyp was found in the mid sigmoid colon. The polyp was sessile.       The polyp was removed with a cold biopsy forceps. Resection and       retrieval were complete.      A 3 mm polyp was found in the proximal ascending colon. The polyp was  sessile. The polyp was removed with a cold biopsy forceps.  Resection and       retrieval were complete.      Non-bleeding internal hemorrhoids were found during anoscopy. The       hemorrhoids were small and Grade I (internal hemorrhoids that do not       prolapse).      The digital rectal exam was normal. Impression:           - Preparation of the colon was fair.                       - One 1 mm polyp in the rectum, removed with a cold                        biopsy forceps. Resected and retrieved.                       - One 3 mm polyp in the mid sigmoid colon, removed with                        a cold biopsy forceps. Resected and retrieved.                       - One 3 mm polyp in the proximal ascending colon,                        removed with a cold biopsy forceps. Resected and                        retrieved.                       - Non-bleeding internal hemorrhoids. Recommendation:       - Discharge patient to home.                       - Telephone GI clinic for pathology results in 1 week. Procedure Code(s):    --- Professional ---                       [phone], Colonoscopy, flexible; with biopsy, single or                        multiple Diagnosis Code(s):    --- Professional ---                       K64.0, First degree hemorrhoids                       K62.1, Rectal polyp                       D12.5, Benign neoplasm of sigmoid colon                       D12.2, Benign neoplasm of ascending colon                       Z86.010, Personal history of colonic polyps CPT copyright 2016 American Medical Association. All rights reserved. The codes documented in this report are preliminary and upon coder review may  be revised  to meet current compliance requirements. Lollie Sails, MD 10/05/2016 9:24:33 AM This report has been signed electronically. Number of Addenda: 0 Note Initiated On: 10/05/2016 8:39 AM Scope Withdrawal Time: 0 hours 8 minutes 16 seconds  Total Procedure Duration: 0 hours 17 minutes 10 seconds       [facility]

## 2016-10-05 NOTE — Anesthesia Preprocedure Evaluation (Signed)
Anesthesia Evaluation  Patient identified by MRN, date of birth, ID band Patient awake    Reviewed: Allergy & Precautions, NPO status , Patient's Chart, lab work & pertinent test results  History of Anesthesia Complications Negative for: history of anesthetic complications  Airway Mallampati: II       Dental   Pulmonary neg pulmonary ROS, former smoker,           Cardiovascular negative cardio ROS       Neuro/Psych negative neurological ROS     GI/Hepatic Neg liver ROS, hiatal hernia, GERD  ,  Endo/Other  negative endocrine ROS  Renal/GU Renal disease (stones)     Musculoskeletal   Abdominal   Peds  Hematology negative hematology ROS (+)   Anesthesia Other Findings   Reproductive/Obstetrics                             Anesthesia Physical Anesthesia Plan  ASA: II  Anesthesia Plan: General   Post-op Pain Management:    Induction:   PONV Risk Score and Plan:   Airway Management Planned:   Additional Equipment:   Intra-op Plan:   Post-operative Plan:   Informed Consent: I have reviewed the patients History and Physical, chart, labs and discussed the procedure including the risks, benefits and alternatives for the proposed anesthesia with the patient or authorized representative who has indicated his/her understanding and acceptance.     Plan Discussed with:   Anesthesia Plan Comments:         Anesthesia Quick Evaluation

## 2016-10-05 NOTE — Anesthesia Postprocedure Evaluation (Signed)
Anesthesia Post Note  Patient: Maurice Lucas  Procedure(s) Performed: Procedure(s) (LRB): COLONOSCOPY WITH PROPOFOL (N/A) ESOPHAGOGASTRODUODENOSCOPY (EGD) WITH PROPOFOL (N/A)  Patient location during evaluation: Endoscopy Anesthesia Type: General Level of consciousness: awake and alert Pain management: pain level controlled Vital Signs Assessment: post-procedure vital signs reviewed and stable Respiratory status: spontaneous breathing and respiratory function stable Cardiovascular status: stable Anesthetic complications: no     Last Vitals:  Vitals:   10/05/16 0935 10/05/16 0945  BP: 100/72 106/76  Pulse: 70 65  Resp: 18 18  Temp:    SpO2: 98% 98%    Last Pain:  Vitals:   10/05/16 0925  TempSrc: Tympanic                 KEPHART,WILLIAM K

## 2016-10-05 NOTE — Op Note (Signed)
[facility] Gastroenterology Patient Name: Maurice Lucas Procedure Date: 10/05/2016 8:40 AM MRN: [id number] Account #: [id number] Date of Birth: 1950/09/13 Admit Type: Outpatient Age: 66 Room: [facility] ENDO ROOM 1 Gender: Male Note Status: Finalized Procedure:            Upper GI endoscopy Indications:          Follow-up of Barrett's esophagus Providers:            Lollie Sails, MD Referring MD:         Sofie Hartigan (Referring MD) Medicines:            Monitored Anesthesia Care Complications:        No immediate complications. Procedure:            Pre-Anesthesia Assessment:                       - ASA Grade Assessment: II - A patient with mild                        systemic disease.                       After obtaining informed consent, the endoscope was                        passed under direct vision. Throughout the procedure,                        the patient's blood pressure, pulse, and oxygen                        saturations were monitored continuously. The Endoscope                        was introduced through the mouth, and advanced to the                        third part of duodenum. The upper GI endoscopy was                        accomplished without difficulty. The patient tolerated                        the procedure well. Findings:      There were esophageal mucosal changes consistent with short-segment       Barrett's esophagus present at the gastroesophageal junction. The       maximum longitudinal extent of these mucosal changes was 1 cm in length.       Mucosa was biopsied with a cold forceps for histology in 4 quadrants.      The entire examined stomach was normal.      The cardia and gastric fundus were normal on retroflexion.      The examined duodenum was normal. Impression:           - Esophageal mucosal changes consistent with                        short-segment Barrett's esophagus. Biopsied.                       -  Normal  stomach.                       - Normal examined duodenum. Recommendation:       - Use Protonix (pantoprazole) 40 mg PO daily daily.                       - Await pathology results.                       - Telephone GI clinic for pathology results in 1 week. Procedure Code(s):    --- Professional ---                       [phone], Esophagogastroduodenoscopy, flexible, transoral;                        with biopsy, single or multiple Diagnosis Code(s):    --- Professional ---                       K22.70, Barrett's esophagus without dysplasia CPT copyright 2016 American Medical Association. All rights reserved. The codes documented in this report are preliminary and upon coder review may  be revised to meet current compliance requirements. Lollie Sails, MD 10/05/2016 9:01:17 AM This report has been signed electronically. Number of Addenda: 0 Note Initiated On: 10/05/2016 8:40 AM      [facility]

## 2016-10-05 NOTE — Anesthesia Post-op Follow-up Note (Signed)
Anesthesia QCDR form completed.        

## 2016-10-05 NOTE — Transfer of Care (Signed)
Immediate Anesthesia Transfer of Care Note  Patient: Maurice Lucas  Procedure(s) Performed: Procedure(s): COLONOSCOPY WITH PROPOFOL (N/A) ESOPHAGOGASTRODUODENOSCOPY (EGD) WITH PROPOFOL (N/A)  Patient Location: PACU  Anesthesia Type:General  Level of Consciousness: sedated  Airway & Oxygen Therapy: Patient Spontanous Breathing and Patient connected to nasal cannula oxygen  Post-op Assessment: Report given to RN and Post -op Vital signs reviewed and stable  Post vital signs: Reviewed and stable  Last Vitals:  Vitals:   10/05/16 0925 10/05/16 0928  BP: 104/85 104/85  Pulse: 79 76  Resp: 20 (!) 21  Temp: (!) 35.8 C   SpO2: 98% 98%    Last Pain:  Vitals:   10/05/16 0925  TempSrc: Tympanic         Complications: No apparent anesthesia complications

## 2016-10-06 ENCOUNTER — Encounter: Payer: Self-pay | Admitting: Gastroenterology

## 2016-10-06 LAB — SURGICAL PATHOLOGY

## 2016-11-25 DIAGNOSIS — K219 Gastro-esophageal reflux disease without esophagitis: Secondary | ICD-10-CM | POA: Diagnosis not present

## 2016-11-25 DIAGNOSIS — E7800 Pure hypercholesterolemia, unspecified: Secondary | ICD-10-CM | POA: Diagnosis not present

## 2016-11-25 DIAGNOSIS — Z125 Encounter for screening for malignant neoplasm of prostate: Secondary | ICD-10-CM | POA: Diagnosis not present

## 2016-12-09 DIAGNOSIS — Z23 Encounter for immunization: Secondary | ICD-10-CM | POA: Diagnosis not present

## 2016-12-28 DIAGNOSIS — R972 Elevated prostate specific antigen [PSA]: Secondary | ICD-10-CM | POA: Diagnosis not present

## 2017-05-27 DIAGNOSIS — K219 Gastro-esophageal reflux disease without esophagitis: Secondary | ICD-10-CM | POA: Diagnosis not present

## 2017-05-27 DIAGNOSIS — E7800 Pure hypercholesterolemia, unspecified: Secondary | ICD-10-CM | POA: Diagnosis not present

## 2017-06-04 DIAGNOSIS — R739 Hyperglycemia, unspecified: Secondary | ICD-10-CM | POA: Diagnosis not present

## 2017-12-01 DIAGNOSIS — Z0000 Encounter for general adult medical examination without abnormal findings: Secondary | ICD-10-CM | POA: Diagnosis not present

## 2017-12-01 DIAGNOSIS — Z23 Encounter for immunization: Secondary | ICD-10-CM | POA: Diagnosis not present

## 2017-12-01 DIAGNOSIS — K219 Gastro-esophageal reflux disease without esophagitis: Secondary | ICD-10-CM | POA: Diagnosis not present

## 2017-12-01 DIAGNOSIS — E7800 Pure hypercholesterolemia, unspecified: Secondary | ICD-10-CM | POA: Diagnosis not present

## 2018-04-28 DIAGNOSIS — K210 Gastro-esophageal reflux disease with esophagitis: Secondary | ICD-10-CM | POA: Diagnosis not present

## 2018-04-28 DIAGNOSIS — K2270 Barrett's esophagus without dysplasia: Secondary | ICD-10-CM | POA: Diagnosis not present

## 2018-04-28 DIAGNOSIS — D369 Benign neoplasm, unspecified site: Secondary | ICD-10-CM | POA: Diagnosis not present

## 2018-06-28 DIAGNOSIS — E7800 Pure hypercholesterolemia, unspecified: Secondary | ICD-10-CM | POA: Diagnosis not present

## 2018-06-28 DIAGNOSIS — R739 Hyperglycemia, unspecified: Secondary | ICD-10-CM | POA: Diagnosis not present

## 2018-06-28 DIAGNOSIS — K219 Gastro-esophageal reflux disease without esophagitis: Secondary | ICD-10-CM | POA: Diagnosis not present

## 2018-06-30 DIAGNOSIS — R252 Cramp and spasm: Secondary | ICD-10-CM | POA: Diagnosis not present

## 2018-06-30 DIAGNOSIS — E7800 Pure hypercholesterolemia, unspecified: Secondary | ICD-10-CM | POA: Diagnosis not present

## 2018-06-30 DIAGNOSIS — R739 Hyperglycemia, unspecified: Secondary | ICD-10-CM | POA: Diagnosis not present

## 2021-10-06 ENCOUNTER — Other Ambulatory Visit: Payer: Self-pay

## 2021-10-06 ENCOUNTER — Emergency Department
Admission: EM | Admit: 2021-10-06 | Discharge: 2021-10-06 | Disposition: A | Discharge status: Home / Self Care | Payer: BC Managed Care – PPO | Attending: Emergency Medicine | Admitting: Emergency Medicine

## 2021-10-06 DIAGNOSIS — N485 Ulcer of penis: Secondary | ICD-10-CM | POA: Insufficient documentation

## 2021-10-06 DIAGNOSIS — L98499 Non-pressure chronic ulcer of skin of other sites with unspecified severity: Secondary | ICD-10-CM | POA: Insufficient documentation

## 2021-10-06 DIAGNOSIS — R590 Localized enlarged lymph nodes: Secondary | ICD-10-CM | POA: Diagnosis not present

## 2021-10-06 DIAGNOSIS — R509 Fever, unspecified: Secondary | ICD-10-CM | POA: Diagnosis present

## 2021-10-06 LAB — COMPREHENSIVE METABOLIC PANEL
ALT: 76 U/L — ABNORMAL HIGH (ref 0–44)
AST: 50 U/L — ABNORMAL HIGH (ref 15–41)
Albumin: 3.8 g/dL (ref 3.5–5.0)
Alkaline Phosphatase: 197 U/L — ABNORMAL HIGH (ref 38–126)
Anion gap: 7 (ref 5–15)
BUN: 8 mg/dL (ref 8–23)
CO2: 29 mmol/L (ref 22–32)
Calcium: 8.6 mg/dL — ABNORMAL LOW (ref 8.9–10.3)
Chloride: 100 mmol/L (ref 98–111)
Creatinine, Ser: 0.95 mg/dL (ref 0.61–1.24)
GFR, Estimated: >60 mL/min (ref >60)
Glucose, Bld: 166 mg/dL — ABNORMAL HIGH (ref 70–99)
Potassium: 3.3 mmol/L — ABNORMAL LOW (ref 3.5–5.1)
Sodium: 136 mmol/L (ref 135–145)
Total Bilirubin: 1 mg/dL (ref 0.3–1.2)
Total Protein: 7.2 g/dL (ref 6.5–8.1)

## 2021-10-06 LAB — CBC WITH DIFFERENTIAL/PLATELET
Abs Immature Granulocytes: 0.01 10*3/uL (ref 0.00–0.07)
Basophils Absolute: 0 10*3/uL (ref 0.0–0.1)
Basophils Relative: 1 %
Eosinophils Absolute: 0 10*3/uL (ref 0.0–0.5)
Eosinophils Relative: 1 %
HCT: 39.6 % (ref 39.0–52.0)
Hemoglobin: 13.4 g/dL (ref 13.0–17.0)
Immature Granulocytes: 0 %
Lymphocytes Relative: 11 %
Lymphs Abs: 0.5 10*3/uL — ABNORMAL LOW (ref 0.7–4.0)
MCH: 30.7 pg (ref 26.0–34.0)
MCHC: 33.8 g/dL (ref 30.0–36.0)
MCV: 90.6 fL (ref 80.0–100.0)
Monocytes Absolute: 0.4 10*3/uL (ref 0.1–1.0)
Monocytes Relative: 8 %
Neutro Abs: 3.7 10*3/uL (ref 1.7–7.7)
Neutrophils Relative %: 79 %
Platelets: 232 10*3/uL (ref 150–400)
RBC: 4.37 MIL/uL (ref 4.22–5.81)
RDW: 12.4 % (ref 11.5–15.5)
WBC: 4.6 10*3/uL (ref 4.0–10.5)
nRBC: 0 % (ref 0.0–0.2)

## 2021-10-06 LAB — URINALYSIS, ROUTINE W REFLEX MICROSCOPIC
Bilirubin Urine: NEGATIVE
Glucose, UA: 150 mg/dL — AB
Hgb urine dipstick: NEGATIVE
Ketones, ur: NEGATIVE mg/dL
Leukocytes,Ua: NEGATIVE
Nitrite: NEGATIVE
Protein, ur: NEGATIVE mg/dL
Specific Gravity, Urine: 1.013 (ref 1.005–1.030)
pH: 7 (ref 5.0–8.0)

## 2021-10-06 LAB — CHLAMYDIA/NGC RT PCR (ARMC ONLY)
Chlamydia Tr: NOT DETECTED
N gonorrhoeae: NOT DETECTED

## 2021-10-06 LAB — LACTIC ACID, PLASMA: Lactic Acid, Venous: 1.3 mmol/L (ref 0.5–1.9)

## 2021-10-06 LAB — HIV ANTIBODY (ROUTINE TESTING W REFLEX): HIV Screen 4th Generation wRfx: NONREACTIVE

## 2021-10-06 MED ORDER — ACETAMINOPHEN 500 MG PO TABS
1000.0000 mg | ORAL_TABLET | Freq: Once | ORAL | Status: AC
  Administered 2021-10-06: 1000 mg via ORAL

## 2021-10-06 MED ORDER — ONDANSETRON HCL 4 MG/2ML IJ SOLN
4.0000 mg | Freq: Once | INTRAMUSCULAR | Status: AC
  Administered 2021-10-06: 4 mg via INTRAVENOUS

## 2021-10-06 MED ORDER — CEFTRIAXONE SODIUM 1 G IJ SOLR: 500.0000 mg | Freq: Once | INTRAMUSCULAR | Status: DC

## 2021-10-06 MED ORDER — DOXYCYCLINE HYCLATE 100 MG PO TABS
100.0000 mg | ORAL_TABLET | Freq: Once | ORAL | Status: AC
  Administered 2021-10-06: 100 mg via ORAL
  Filled 2021-10-06: qty 1

## 2021-10-06 MED ORDER — ACETAMINOPHEN 500 MG PO TABS
ORAL_TABLET | ORAL | Status: AC
  Filled 2021-10-06: qty 2

## 2021-10-06 MED ORDER — SODIUM CHLORIDE 0.9 % IV SOLN
1.0000 g | Freq: Once | INTRAVENOUS | Status: AC
  Administered 2021-10-06: 1 g via INTRAVENOUS
  Filled 2021-10-06: qty 10

## 2021-10-06 MED ORDER — ONDANSETRON HCL 4 MG/2ML IJ SOLN
INTRAMUSCULAR | Status: AC
  Filled 2021-10-06: qty 2

## 2021-10-06 MED ORDER — DOXYCYCLINE HYCLATE 100 MG PO CAPS: 100.0000 mg | ORAL_CAPSULE | Freq: Two times a day (BID) | ORAL | 0 refills | Status: DC

## 2021-10-06 MED ORDER — POTASSIUM CHLORIDE CRYS ER 20 MEQ PO TBCR
40.0000 meq | EXTENDED_RELEASE_TABLET | Freq: Once | ORAL | Status: AC
  Administered 2021-10-06: 40 meq via ORAL
  Filled 2021-10-06: qty 2

## 2021-10-06 MED ORDER — DOXYCYCLINE HYCLATE 100 MG PO CAPS: 100.0000 mg | ORAL_CAPSULE | Freq: Two times a day (BID) | ORAL | 0 refills | Status: AC

## 2021-10-06 NOTE — ED Provider Notes (Addendum)
[facility] Provider Note    Event Date/Time   First MD Initiated Contact with Patient 10/06/21 1148     (approximate)   History   Chief Complaint Fever   HPI  Maurice Lucas is a 71 y.o. male with past medical history of hyperlipidemia and GERD who presents to the ED complaining of fever.  Patient reports that he has been dealing with pain and swelling around his penis and both sides of his groin for about the past week.  He first noted to notice some ulceration just proximal to the glans of his penis at the beginning of the week, since then has had increasing redness and swelling to that same area.  Over the past couple days, he has had increased redness and swelling to both sides of his groin, which she states is tender to palpation.  He has begun having subjective fevers and chills for the past 2 nights, but has not been able to check his temperature at home.  He does report taking a dose of ibuprofen earlier this morning.  He admits to oral intercourse with a new partner about 1 month ago, denies recent vaginal or anal intercourse.  He endorses some burning when he urinates but has not noticed any penile discharge.  He has not had any pain in his abdomen, nausea, vomiting, or changes in his bowel movements.     Physical Exam   Triage Vital Signs: ED Triage Vitals [10/06/21 0946]  Enc Vitals Group     BP (!) 149/77     Pulse Rate 84     Resp 18     Temp 98.3 F (36.8 C)     Temp Source Oral     SpO2 99 %     Weight 192 lb (87.1 kg)     Height '5\' 6"'$  (1.676 m)     Head Circumference      Peak Flow      Pain Score 0     Pain Loc      Pain Edu?      Excl. in [place]?     Most recent vital signs: Vitals:   10/06/21 0946 10/06/21 1128  BP: (!) 149/77 (!) 164/76  Pulse: 84 95  Resp: 18 16  Temp: 98.3 F (36.8 C) 99 F (37.2 C)  SpO2: 99% 99%    Constitutional: Alert and oriented. Eyes: Conjunctivae are normal. Head: Atraumatic. Nose: No  congestion/rhinnorhea. Mouth/Throat: Mucous membranes are moist.  Cardiovascular: Normal rate, regular rhythm. Grossly normal heart sounds.  2+ radial pulses bilaterally. Respiratory: Normal respiratory effort.  No retractions. Lungs CTAB. Gastrointestinal: Soft and nontender. No distention. Genitourinary: 2 separate nontender ulcerations to the dorsum of his penis just proximal to the glans with associated erythema and edema.  Additional smaller ulceration noted to the right lateral shaft of his penis that is also nontender.  Edema and erythema noted to both sides of his groin with palpable tender lymph nodes.  No penile discharge noted. Musculoskeletal: No lower extremity tenderness nor edema.  Neurologic:  Normal speech and language. No gross focal neurologic deficits are appreciated.    ED Results / Procedures / Treatments   Labs (all labs ordered are listed, but only abnormal results are displayed) Labs Reviewed  COMPREHENSIVE METABOLIC PANEL - Abnormal; Notable for the following components:      Result Value   Potassium 3.3 (*)    Glucose, Bld 166 (*)    Calcium 8.6 (*)  AST 50 (*)    ALT 76 (*)    Alkaline Phosphatase 197 (*)    All other components within normal limits  CBC WITH DIFFERENTIAL/PLATELET - Abnormal; Notable for the following components:   Lymphs Abs 0.5 (*)    All other components within normal limits  URINALYSIS, ROUTINE W REFLEX MICROSCOPIC - Abnormal; Notable for the following components:   Color, Urine YELLOW (*)    APPearance HAZY (*)    Glucose, UA 150 (*)    All other components within normal limits  CHLAMYDIA/NGC RT PCR ([facility] ONLY)            LACTIC ACID, PLASMA  HIV ANTIBODY (ROUTINE TESTING W REFLEX)  RPR    PROCEDURES:  Critical Care performed: No  Procedures   MEDICATIONS ORDERED IN ED: Medications  cefTRIAXone (ROCEPHIN) 1 g in sodium chloride 0.9 % 100 mL IVPB (1 g Intravenous New Bag/Given 10/06/21 1316)  potassium chloride SA  (KLOR-CON M) CR tablet 40 mEq (has no administration in time range)  doxycycline (VIBRA-TABS) tablet 100 mg (100 mg Oral Given 10/06/21 1316)     IMPRESSION / MDM / ASSESSMENT AND PLAN / ED COURSE  I reviewed the triage vital signs and the nursing notes.                              71 y.o. male with past medical history of hyperlipidemia and GERD who presents to the ED with 1 week of worsening ulceration to his penis with bilateral redness and swelling to his groin area.  Patient's presentation is most consistent with acute presentation with potential threat to life or bodily function.  Differential diagnosis includes, but is not limited to, sepsis, UTI, urethritis, chancroid, lymphogranuloma venereum, syphilis, HIV, gonorrhea, chlamydia.  Patient nontoxic-appearing and in no acute distress, vital signs are reassuring and do not appear consistent with sepsis.  Presentation is concerning for sexually transmitted infection with ulceration to his penis along with bilateral tender and erythematous lymphadenopathy.  He admits to recent sexual encounter with new partner when wife was asked to leave the room briefly.  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  Labs remarkable for mild hypokalemia and transaminitis, no significant AKI, leukocytosis, or anemia.  Lactic acid within normal limits.  Testing was sent for HIV, syphilis, GC, and chlamydia.  We will treat empirically with Rocephin and doxycycline, case discussed with Dr. Delaine Lame of infectious disease, who agrees with plan for empiric treatment and will schedule follow-up with the patient in 8 days.  He was counseled to return to the ED for new or worsening symptoms, patient agrees with plan.  Patient noted to now be febrile just prior to discharge, which we will treat with Tylenol.  He remains well-appearing overall, otherwise not SIRS positive and does not meet  criteria for sepsis.  He remains appropriate for discharge home with close infectious disease follow-up, was counseled to return to the ED for new or worsening symptoms.  Patient agrees with plan.      FINAL CLINICAL IMPRESSION(S) / ED DIAGNOSES   Final diagnoses:  Groin ulcer ([place])  Lymphadenopathy, inguinal     Rx / DC Orders   ED Discharge Orders     None        Note:  This document was prepared using Dragon voice recognition software and may include unintentional dictation errors.   Blake Divine, MD 10/06/21 1336  Blake Divine, MD 10/06/21 [phone]

## 2021-10-06 NOTE — ED Triage Notes (Signed)
Pt to ED via POV from home. Pt reports fever on and off since last night. Pt states he currently has an infection/cyst in his groin on both side and has not been seen for it. Pt states some pain with urination.

## 2021-10-06 NOTE — ED Notes (Signed)
Rocephin not yet finished running. Pt reminded to keep his arm straight.

## 2021-10-06 NOTE — ED Notes (Signed)
Pt vomited immediately after taking potassium pills.

## 2021-10-06 NOTE — ED Notes (Signed)
Pt offered wheelchair out but pt refused.

## 2021-10-06 NOTE — ED Notes (Signed)
Says fever since around Saturday.  Redness to penis and bilat groin swelling.  Is having chills right now.  Ambulatory to room.

## 2021-10-07 ENCOUNTER — Telehealth: Payer: Self-pay | Admitting: Emergency Medicine

## 2021-10-07 LAB — RPR
RPR Ser Ql: REACTIVE — AB
RPR Titer: 1:8 {titer}

## 2021-10-07 NOTE — Telephone Encounter (Signed)
Called patient and spoke with him.  Informed of positive syphilis test and need for treatment.  He will go in the am to [facility] for antibiotic injection.  I spoke to him about following up with ID doctor about possible further treatment.  I told him the number is on his instructions.  I told him about partner treatment.

## 2021-10-07 NOTE — Telephone Encounter (Signed)
Called patient to inform of RPR result and need for treatment.  No answer and no VM available.  Will try again later.  He needs to either go to urgent/acute care or return here for treatment.

## 2021-10-08 LAB — T.PALLIDUM AB, TOTAL: T Pallidum Abs: REACTIVE — AB

## 2021-10-09 ENCOUNTER — Encounter: Payer: Self-pay | Admitting: Emergency Medicine

## 2021-10-09 ENCOUNTER — Other Ambulatory Visit: Payer: Self-pay

## 2021-10-09 ENCOUNTER — Emergency Department
Admission: EM | Admit: 2021-10-09 | Discharge: 2021-10-09 | Disposition: A | Discharge status: Home / Self Care | Payer: BC Managed Care – PPO | Attending: Student in an Organized Health Care Education/Training Program | Admitting: Student in an Organized Health Care Education/Training Program

## 2021-10-09 DIAGNOSIS — A539 Syphilis, unspecified: Secondary | ICD-10-CM | POA: Insufficient documentation

## 2021-10-09 MED ORDER — PENICILLIN G BENZATHINE 1200000 UNIT/2ML IM SUSY
2.4000 10*6.[IU] | PREFILLED_SYRINGE | Freq: Once | INTRAMUSCULAR | Status: AC
  Administered 2021-10-09: 2.4 10*6.[IU] via INTRAMUSCULAR
  Filled 2021-10-09: qty 4

## 2021-10-09 NOTE — ED Triage Notes (Signed)
Patient ambulatory to triage with steady gait, without difficulty or distress noted; pt reports was seen recently and treated for STD's; per chart pt received IV ds rocephin and rx for doxy; was called after d/c and told that he needed to f/u with [facility] for penicillin injection for +syphillis; [facility] informed him that he would need to see his PCP for such and then told by his PCP he needed to go local pharmacy and was told by pharmacy that med not available for at least next month

## 2021-10-09 NOTE — ED Notes (Signed)
See triage note   Presents with abnormal labs  States he needs additional meds  Denies any other sx's

## 2021-10-09 NOTE — ED Provider Notes (Signed)
   [facility] Provider Note    Event Date/Time   First MD Initiated Contact with Patient 10/09/21 0745     (approximate)   History   SEXUALLY TRANSMITTED DISEASE   HPI  Maurice Lucas is a 71 y.o. male   with recent diagnosis of syphilis presents for penicillin injection.  Patient saw his PCP prescription for Bicillin was ordered but local pharmacies do not have this in stock and was directed to the ER for treatment.  Patient denies any new complaint states he otherwise feels well.      Physical Exam   Triage Vital Signs: ED Triage Vitals  Enc Vitals Group     BP 10/09/21 0712 128/75     Pulse Rate 10/09/21 0712 71     Resp 10/09/21 0712 17     Temp 10/09/21 0712 97.6 F (36.4 C)     Temp Source 10/09/21 0712 Oral     SpO2 10/09/21 0712 98 %     Weight 10/09/21 0706 192 lb 0.3 oz (87.1 kg)     Height 10/09/21 0706 '5\' 6"'$  (1.676 m)     Head Circumference --      Peak Flow --      Pain Score 10/09/21 0707 0     Pain Loc --      Pain Edu? --      Excl. in [place]? --     Most recent vital signs: Vitals:   10/09/21 0712  BP: 128/75  Pulse: 71  Resp: 17  Temp: 97.6 F (36.4 C)  SpO2: 98%     Constitutional: Alert  Eyes: Conjunctivae are normal.  Head: Atraumatic. Nose: No congestion/rhinnorhea. Mouth/Throat: Mucous membranes are moist.   Cardiovascular:   well perfused. Respiratory: Normal respiratory effort.   Gastrointestinal: Soft and nontender.  Musculoskeletal:  no deformity Neurologic:  MAE spontaneously. No gross focal neurologic deficits are appreciated.    ED Results / Procedures / Treatments   Labs (all labs ordered are listed, but only abnormal results are displayed) Labs Reviewed - No data to display   EKG     RADIOLOGY    PROCEDURES:  Critical Care performed:   Procedures   MEDICATIONS ORDERED IN ED: Medications  penicillin g benzathine (BICILLIN LA) 1200000 UNIT/2ML injection 2.4 Million Units (has no  administration in time range)     IMPRESSION / MDM / ASSESSMENT AND PLAN / ED COURSE  I reviewed the triage vital signs and the nursing notes.                              Differential diagnosis includes, but is not limited to, syphilis, STD, ulcer  Patient presented to the ER for evaluation and need for Bicillin injection.  No other complaints or concerns.  He has no history of allergic reaction to penicillin.  Bicillin ordered.  Patient to follow-up with PCP.       FINAL CLINICAL IMPRESSION(S) / ED DIAGNOSES   Final diagnoses:  Syphilis     Rx / DC Orders   ED Discharge Orders     None        Note:  This document was prepared using Dragon voice recognition software and may include unintentional dictation errors.    Merlyn Lot, MD 10/09/21 [phone]

## 2021-10-09 NOTE — Discharge Instructions (Addendum)
Follow up with PCP

## 2021-10-14 ENCOUNTER — Other Ambulatory Visit
Admission: RE | Admit: 2021-10-14 | Discharge: 2021-10-14 | Disposition: A | Discharge status: Home / Self Care | Payer: BC Managed Care – PPO | Attending: Infectious Diseases | Admitting: Infectious Diseases

## 2021-10-14 ENCOUNTER — Other Ambulatory Visit: Payer: Self-pay | Admitting: Infectious Diseases

## 2021-10-14 ENCOUNTER — Encounter: Payer: Self-pay | Admitting: Infectious Diseases

## 2021-10-14 ENCOUNTER — Ambulatory Visit: Payer: BC Managed Care – PPO | Attending: Infectious Diseases | Admitting: Infectious Diseases

## 2021-10-14 VITALS — BP 117/77 | HR 73 | Temp 97.7°F | Ht 66.0 in | Wt 182.0 lb

## 2021-10-14 DIAGNOSIS — N5089 Other specified disorders of the male genital organs: Secondary | ICD-10-CM | POA: Insufficient documentation

## 2021-10-14 DIAGNOSIS — R7401 Elevation of levels of liver transaminase levels: Secondary | ICD-10-CM

## 2021-10-14 LAB — HEPATIC FUNCTION PANEL
ALT: 48 U/L — ABNORMAL HIGH (ref 0–44)
AST: 34 U/L (ref 15–41)
Albumin: 3.8 g/dL (ref 3.5–5.0)
Alkaline Phosphatase: 172 U/L — ABNORMAL HIGH (ref 38–126)
Bilirubin, Direct: 0.2 mg/dL (ref 0.0–0.2)
Indirect Bilirubin: 0.4 mg/dL (ref 0.3–0.9)
Total Bilirubin: 0.6 mg/dL (ref 0.3–1.2)
Total Protein: 7.6 g/dL (ref 6.5–8.1)

## 2021-10-14 MED ORDER — VALACYCLOVIR HCL 1 G PO TABS: 1000.0000 mg | ORAL_TABLET | Freq: Two times a day (BID) | ORAL | 0 refills | Status: AC

## 2021-10-14 NOTE — Progress Notes (Signed)
NAME: Maurice Lucas  DOB: [birth year]  MRN: [id number]  Date/Time: 10/14/2021 11:24 AM   Pt is here with his wife and I sent her out to talk in detail with the patient ? Maurice Lucas is a 71 y.o. male with a history of HLD presented to ED a week ago with genital ulcers and swelling b/l groin STD screening was done and he was given Iv ceftriaxone and Po doxy for 21 days as concern for LGV. The RPR came positive at 1:8 and he was called back to get penicillin Pt had receptive oral sex a month ago with a new male partner (stranger) Married to wife for 31 yrs Not sexually active with her   Past Medical History:  Diagnosis Date   Barrett esophagus    Chronic kidney disease    kidney stones   GERD (gastroesophageal reflux disease)    Hepatic hemangioma    History of hiatal hernia    Hyperlipidemia    Kidney stone     Past Surgical History:  Procedure Laterality Date   CARPAL TUNNEL RELEASE Right 05/22/2015   Procedure: CARPAL TUNNEL RELEASE;  Surgeon: Hessie Knows, MD;  Location: [facility] ORS;  Service: Orthopedics;  Laterality: Right;   COLONOSCOPY     COLONOSCOPY WITH PROPOFOL N/A 10/05/2016   Procedure: COLONOSCOPY WITH PROPOFOL;  Surgeon: Lollie Sails, MD;  Location: [facility] ENDOSCOPY;  Service: Endoscopy;  Laterality: N/A;   ESOPHAGOGASTRODUODENOSCOPY     ESOPHAGOGASTRODUODENOSCOPY (EGD) WITH PROPOFOL N/A 11/02/2014   Procedure: ESOPHAGOGASTRODUODENOSCOPY (EGD) WITH PROPOFOL;  Surgeon: Lollie Sails, MD;  Location: [facility] ENDOSCOPY;  Service: Endoscopy;  Laterality: N/A;   ESOPHAGOGASTRODUODENOSCOPY (EGD) WITH PROPOFOL N/A 10/05/2016   Procedure: ESOPHAGOGASTRODUODENOSCOPY (EGD) WITH PROPOFOL;  Surgeon: Lollie Sails, MD;  Location: [facility] ENDOSCOPY;  Service: Endoscopy;  Laterality: N/A;   IRRIGATION AND DEBRIDEMENT KNEE Left 05/22/2015   Procedure: IRRIGATION AND DEBRIDEMENT KNEE WITH PATELLA TENDON REPAIR AND LACERATION REPAIR;  Surgeon: Hessie Knows, MD;  Location: [facility] ORS;   Service: Orthopedics;  Laterality: Left;   KNEE ARTHROSCOPY Right    KNEE ARTHROSCOPY Left 08/07/2014   KNEE ARTHROSCOPY WITH MEDIAL MENISECTOMY Left 08/07/2014   Procedure: KNEE ARTHROSCOPY WITH MEDIAL MENISECTOMY;  Surgeon: Hessie Knows, MD;  Location: [facility] ORS;  Service: Orthopedics;  Laterality: Left;  partial medial menisectomy, plica incsion   NOSE SURGERY     OPEN REDUCTION INTERNAL FIXATION (ORIF) DISTAL RADIAL FRACTURE Right 05/22/2015   Procedure: OPEN REDUCTION INTERNAL FIXATION (ORIF) DISTAL RADIAL FRACTURE;  Surgeon: Hessie Knows, MD;  Location: [facility] ORS;  Service: Orthopedics;  Laterality: Right;    Social History   Socioeconomic History   Marital status: Married    Spouse name: Not on file   Number of children: Not on file   Years of education: Not on file   Highest education level: Not on file  Occupational History   Not on file  Tobacco Use   Smoking status: Former    Types: Cigarettes   Smokeless tobacco: Former    Types: Nurse, children's Use: Never used  Substance and Sexual Activity   Alcohol use: No   Drug use: No   Sexual activity: Not on file  Other Topics Concern   Not on file  Social History Narrative   Not on file   Social Determinants of Health   Financial Resource Strain: Not on file  Food Insecurity: Not on file  Transportation Needs: Not on file  Physical Activity: Not on  file  Stress: Not on file  Social Connections: Not on file  Intimate Partner Violence: Not on file    Family History  Problem Relation Age of Onset   Leukemia Mother    Leukemia Sister    No Known Allergies I? Current Outpatient Medications  Medication Sig Dispense Refill   dexamethasone (DECADRON) 4 MG tablet Take 6 tablets on Day 1 with food, then decrease by 1 tablet daily until finished (6,5,4,3,2,1) 21 tablet 0   doxycycline (VIBRAMYCIN) 100 MG capsule Take 1 capsule (100 mg total) by mouth 2 (two) times daily for 21 days. 42 capsule 0    fexofenadine-pseudoephedrine (ALLEGRA-D ALLERGY & CONGESTION) 180-240 MG 24 hr tablet Take 1 tablet by mouth daily. 30 tablet 0   Omega-3 Fatty Acids (FISH OIL CONCENTRATE PO) Take 1 capsule by mouth daily.     pantoprazole (PROTONIX) 40 MG tablet Take 40 mg by mouth every morning.     simvastatin (ZOCOR) 20 MG tablet Take 20 mg by mouth at bedtime.     No current facility-administered medications for this visit.     Abtx:  Anti-infectives (From admission, onward)    None       REVIEW OF SYSTEMS:  Const: had some fever and chills, , negative weight loss Eyes: negative diplopia or visual changes, negative eye pain ENT: negative coryza, negative sore throat Resp: negative cough, hemoptysis, dyspnea Cards: negative for chest pain, palpitations, lower extremity edema GU: negative for frequency, dysuria and hematuria GI: Negative for abdominal pain, diarrhea, bleeding, constipation Skin: negative for rash and pruritus Heme: negative for easy bruising and gum/nose bleeding MS: negative for myalgias, arthralgias, back pain and muscle weakness Neurolo:negative for headaches, dizziness, vertigo, memory problems  Psych:  anxiety, depression because of this episode Endocrine: negative for thyroid, diabetes Allergy/Immunology- negative for any medication or food allergies ?  Objective:  VITALS:  BP 117/77   Pulse 73   Temp 97.7 F (36.5 C) (Temporal)   Ht '5\' 6"'$  (1.676 m)   Wt 182 lb (82.6 kg)   BMI 29.38 kg/m   PHYSICAL EXAM:  General: Alert, cooperative, no distress, appears stated age.  Head: Normocephalic, without obvious abnormality, atraumatic. Eyes: Conjunctivae clear, anicteric sclerae. Pupils are equal ENT Nares normal. No drainage or sinus tenderness. Lips, mucosa, and tongue normal. No Thrush Neck: Supple, symmetrical, no adenopathy, thyroid: non tender no carotid bruit and no JVD. Back: No CVA tenderness. Lungs: Clear to auscultation bilaterally. No Wheezing or  Rhonchi. No rales. Heart: Regular rate and rhythm, no murmur, rub or gallop. Abdomen: Soft, non-tender,not distended. Bowel sounds normal. No masses Extremities: atraumatic, no cyanosis. No edema. No clubbing Skin: No rashes or lesions. Or bruising Lymph: Cervical, supraclavicular normal. Neurologic: Grossly non-focal  Vgenital- ulcers over the glans  B/l inguinal adenopathy     Lower lip sores Pertinent Labs Lab Results CBC    Component Value Date/Time   WBC 4.6 10/06/2021 0950   RBC 4.37 10/06/2021 0950   HGB 13.4 10/06/2021 0950   HGB 14.2 05/19/2011 0736   HCT 39.6 10/06/2021 0950   HCT 42.0 05/19/2011 0736   PLT 232 10/06/2021 0950   PLT 219 05/19/2011 0736   MCV 90.6 10/06/2021 0950   MCV 93 05/19/2011 0736   MCH 30.7 10/06/2021 0950   MCHC 33.8 10/06/2021 0950   RDW 12.4 10/06/2021 0950   RDW 13.4 05/19/2011 0736   LYMPHSABS 0.5 (L) 10/06/2021 0950   MONOABS 0.4 10/06/2021 0950   EOSABS 0.0 10/06/2021  0950   BASOSABS 0.0 10/06/2021 0950       Latest Ref Rng & Units 10/06/2021    9:50 AM 11/12/2015   11:50 AM 05/22/2015    9:52 AM  CMP  Glucose 70 - 99 mg/dL 166  116  161   BUN 8 - 23 mg/dL '8  10  13   '$ Creatinine 0.61 - 1.24 mg/dL 0.95  1.17  1.24   Sodium 135 - 145 mmol/L 136  138  137   Potassium 3.5 - 5.1 mmol/L 3.3  4.1  3.5   Chloride 98 - 111 mmol/L 100  101  104   CO2 22 - 32 mmol/L '29  30  28   '$ Calcium 8.9 - 10.3 mg/dL 8.6  9.2  8.9   Total Protein 6.5 - 8.1 g/dL 7.2     Total Bilirubin 0.3 - 1.2 mg/dL 1.0     Alkaline Phos 38 - 126 U/L 197     AST 15 - 41 U/L 50     ALT 0 - 44 U/L 76         Microbiology: Recent Results (from the past 240 hour(s))  Chlamydia/NGC rt PCR ([place] only)     Status: None   Collection Time: 10/06/21  9:50 AM   Specimen: Urine  Result Value Ref Range Status   Specimen source GC/Chlam URINE, RANDOM  Final   Chlamydia Tr NOT DETECTED NOT DETECTED Final   N gonorrhoeae NOT DETECTED NOT DETECTED Final    Comment:  (NOTE) This CT/NG assay has not been evaluated in patients with a history of  hysterectomy. Performed at [facility], [address], [place], [place] [address]     ? 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 _Transaminitis- will check hepatitis panel __________________________________________________  Note:  This document was prepared using Dragon voice recognition software and may include unintentional dictation errors.

## 2021-10-14 NOTE — Patient Instructions (Signed)
You are here for genital ulcer- will do same labs- will start valtrex 1 gram twice a day for 10 days for possible HSV

## 2021-10-15 LAB — HIV-1 RNA QUANT-NO REFLEX-BLD
HIV 1 RNA Quant: <20 copies/mL
LOG10 HIV-1 RNA: UNDETERMINED log10copy/mL

## 2021-10-15 LAB — RPR
RPR Ser Ql: REACTIVE — AB
RPR Titer: 1:32 {titer}

## 2021-10-15 LAB — HSV DNA BY PCR (REFERENCE LAB)
HSV 1 DNA: POSITIVE — AB
HSV 2 DNA: NEGATIVE

## 2021-10-16 LAB — MISC LABCORP TEST (SEND OUT): Labcorp test code: 96180

## 2021-10-16 LAB — T.PALLIDUM AB, TOTAL: T Pallidum Abs: REACTIVE — AB

## 2021-10-20 ENCOUNTER — Telehealth: Payer: Self-pay

## 2021-10-20 LAB — CHLAMYDIA PANEL IGM, SERUM
C. Pneumoniae IgM Serum: <1:10 {titer}
C. Psittaci IgM Serum: <1:10 {titer}
C. Trachomatis IgM Serum: <1:10 {titer}

## 2021-10-20 NOTE — Telephone Encounter (Signed)
Called patient to relay message below, no answer left voice message.

## 2021-10-20 NOTE — Telephone Encounter (Signed)
-----   Message from Tsosie Billing, MD sent at 10/20/2021 11:15 AM EDT ----- Can you please let him know that the herpes test came back positive from the swab of the ulcer- He is on valtrex- he needs to complete it- he also has syphilis and got treatment for that. Please ask him how he is doing? And to keep his follow up appt as scheduled- thx  ----- Message ----- From: Interface, Lab In [place] Sent: 10/14/2021  12:50 PM EDT To: Tsosie Billing, MD

## 2021-10-20 NOTE — Progress Notes (Signed)
[facility] received CD report re 10/06/21 syphilis testing.  10/06/21 testing: RPR Reactive, 1:8 T Pallidum = Reactive See [facility] notes re symptoms/groin ulcer.  Ref also subsequent testing: 10/14/21 test RPR Reactive, 1:32 T Pallidum = Reactive  8/17/213 [facility] ED visit: Bicillin LA 2.4 MU administered

## 2021-10-20 NOTE — Telephone Encounter (Signed)
I spoke to the patient and he states he is doing better. Patient advised of all lab results and to complete the valtrex.  Maurice Lucas [name] [name]

## 2021-10-28 ENCOUNTER — Ambulatory Visit: Payer: BC Managed Care – PPO | Attending: Infectious Diseases | Admitting: Infectious Diseases

## 2021-10-28 ENCOUNTER — Encounter: Payer: Self-pay | Admitting: Infectious Diseases

## 2021-10-28 ENCOUNTER — Other Ambulatory Visit
Admission: RE | Admit: 2021-10-28 | Discharge: 2021-10-28 | Disposition: A | Discharge status: Home / Self Care | Payer: BC Managed Care – PPO | Source: Ambulatory Visit | Attending: Infectious Diseases | Admitting: Infectious Diseases

## 2021-10-28 VITALS — BP 102/69 | HR 66 | Temp 98.4°F | Ht 66.0 in | Wt 181.0 lb

## 2021-10-28 DIAGNOSIS — R7401 Elevation of levels of liver transaminase levels: Secondary | ICD-10-CM | POA: Diagnosis not present

## 2021-10-28 DIAGNOSIS — A539 Syphilis, unspecified: Secondary | ICD-10-CM

## 2021-10-28 LAB — HEPATITIS PANEL, ACUTE
HCV Ab: NONREACTIVE
Hep A IgM: NONREACTIVE
Hep B C IgM: NONREACTIVE
Hepatitis B Surface Ag: NONREACTIVE

## 2021-10-28 NOTE — Progress Notes (Unsigned)
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  Following from last visit Maurice Lucas is a 71 y.o. male with a history of HLD presented to ED a week ago with genital ulcers and swelling b/l groin STD screening was done and he was given Iv ceftriaxone and Po doxy for 21 days as concern for LGV. The RPR came positive at 1:8 and he was called back to get penicillin Pt had receptive oral sex a month ago with a new male partner (stranger) Married to wife for 54 yrs Not sexually active with her   Past Medical History:  Diagnosis Date   Barrett esophagus    Chronic kidney disease    kidney stones   GERD (gastroesophageal reflux disease)    Hepatic hemangioma    History of hiatal hernia    Hyperlipidemia    Kidney stone     Past Surgical History:  Procedure Laterality Date   CARPAL TUNNEL RELEASE Right 05/22/2015   Procedure: CARPAL TUNNEL RELEASE;  Surgeon: Hessie Knows, MD;  Location: [facility] ORS;  Service: Orthopedics;  Laterality: Right;   COLONOSCOPY     COLONOSCOPY WITH PROPOFOL N/A 10/05/2016   Procedure: COLONOSCOPY WITH PROPOFOL;  Surgeon: Lollie Sails, MD;  Location: [facility] ENDOSCOPY;  Service: Endoscopy;  Laterality: N/A;   ESOPHAGOGASTRODUODENOSCOPY     ESOPHAGOGASTRODUODENOSCOPY (EGD) WITH PROPOFOL N/A 11/02/2014   Procedure: ESOPHAGOGASTRODUODENOSCOPY (EGD) WITH PROPOFOL;  Surgeon: Lollie Sails, MD;  Location: [facility] ENDOSCOPY;  Service: Endoscopy;  Laterality: N/A;   ESOPHAGOGASTRODUODENOSCOPY (EGD) WITH PROPOFOL N/A 10/05/2016   Procedure: ESOPHAGOGASTRODUODENOSCOPY (EGD) WITH PROPOFOL;  Surgeon: Lollie Sails, MD;  Location: [facility] ENDOSCOPY;  Service: Endoscopy;  Laterality: N/A;    IRRIGATION AND DEBRIDEMENT KNEE Left 05/22/2015   Procedure: IRRIGATION AND DEBRIDEMENT KNEE WITH PATELLA TENDON REPAIR AND LACERATION REPAIR;  Surgeon: Hessie Knows, MD;  Location: [facility] ORS;  Service: Orthopedics;  Laterality: Left;   KNEE ARTHROSCOPY Right    KNEE ARTHROSCOPY Left 08/07/2014   KNEE ARTHROSCOPY WITH MEDIAL MENISECTOMY Left 08/07/2014   Procedure: KNEE ARTHROSCOPY WITH MEDIAL MENISECTOMY;  Surgeon: Hessie Knows, MD;  Location: [facility] ORS;  Service: Orthopedics;  Laterality: Left;  partial medial menisectomy, plica incsion   NOSE SURGERY     OPEN REDUCTION INTERNAL FIXATION (ORIF) DISTAL RADIAL FRACTURE Right 05/22/2015   Procedure: OPEN REDUCTION INTERNAL FIXATION (ORIF) DISTAL RADIAL FRACTURE;  Surgeon: Hessie Knows, MD;  Location: [facility] ORS;  Service: Orthopedics;  Laterality: Right;    Social History   Socioeconomic History   Marital status: Married    Spouse name: Not on file   Number of children: Not on file   Years of education: Not on file   Highest education level: Not on file  Occupational History   Not on file  Tobacco Use   Smoking status: Former    Types: Cigarettes   Smokeless tobacco: Former    Types: Nurse, children's Use: Never used  Substance and Sexual Activity   Alcohol use: No   Drug use: No   Sexual activity: Not on file  Other Topics Concern   Not on file  Social History Narrative   Not on file  Social Determinants of Health   Financial Resource Strain: Not on file  Food Insecurity: Not on file  Transportation Needs: Not on file  Physical Activity: Not on file  Stress: Not on file  Social Connections: Not on file  Intimate Partner Violence: Not on file    Family History  Problem Relation Age of Onset   Leukemia Mother    Leukemia Sister    No Known Allergies I? Current Outpatient Medications  Medication Sig Dispense Refill   dexamethasone (DECADRON) 4 MG tablet Take 6 tablets on Day 1 with food, then decrease by 1 tablet  daily until finished (6,5,4,3,2,1) 21 tablet 0   fexofenadine-pseudoephedrine (ALLEGRA-D ALLERGY & CONGESTION) 180-240 MG 24 hr tablet Take 1 tablet by mouth daily. 30 tablet 0   Omega-3 Fatty Acids (FISH OIL CONCENTRATE PO) Take 1 capsule by mouth daily.     pantoprazole (PROTONIX) 40 MG tablet Take 40 mg by mouth every morning.     simvastatin (ZOCOR) 20 MG tablet Take 20 mg by mouth at bedtime.     valACYclovir (VALTREX) 1000 MG tablet Take 1 tablet (1,000 mg total) by mouth 2 (two) times daily. (Patient not taking: Reported on 10/28/2021) 20 tablet 0   No current facility-administered medications for this visit.     Abtx:  Anti-infectives (From admission, onward)    None       REVIEW OF SYSTEMS:  Const: had some fever and chills, , negative weight loss Eyes: negative diplopia or visual changes, negative eye pain ENT: negative coryza, negative sore throat Resp: negative cough, hemoptysis, dyspnea Cards: negative for chest pain, palpitations, lower extremity edema GU: negative for frequency, dysuria and hematuria GI: Negative for abdominal pain, diarrhea, bleeding, constipation Skin: negative for rash and pruritus Heme: negative for easy bruising and gum/nose bleeding MS: negative for myalgias, arthralgias, back pain and muscle weakness Neurolo:negative for headaches, dizziness, vertigo, memory problems  Psych:  anxiety, depression because of this episode Endocrine: negative for thyroid, diabetes Allergy/Immunology- negative for any medication or food allergies ?  Objective:  VITALS:  BP 102/69   Pulse 66   Temp 98.4 F (36.9 C) (Temporal)   Ht '5\' 6"'$  (1.676 m)   Wt 181 lb (82.1 kg)   BMI 29.21 kg/m   PHYSICAL EXAM:  General: Alert, cooperative, no distress, appears stated age.  Head: Normocephalic, without obvious abnormality, atraumatic. Eyes: Conjunctivae clear, anicteric sclerae. Pupils are equal ENT Nares normal. No drainage or sinus tenderness. Lips, mucosa,  and tongue normal. No Thrush Neck: Supple, symmetrical, no adenopathy, thyroid: non tender no carotid bruit and no JVD. Back: No CVA tenderness. Lungs: Clear to auscultation bilaterally. No Wheezing or Rhonchi. No rales. Heart: Regular rate and rhythm, no murmur, rub or gallop. Abdomen: Soft, non-tender,not distended. Bowel sounds normal. No masses Extremities: atraumatic, no cyanosis. No edema. No clubbing Skin: No rashes or lesions. Or bruising Lymph: Cervical, supraclavicular normal. Neurologic: Grossly non-focal  genital- ulcers over the glans  completely resolved B/l inguinal adenopathy smaller  Pertinent Labs Lab Results CBC    Component Value Date/Time   WBC 4.6 10/06/2021 0950   RBC 4.37 10/06/2021 0950   HGB 13.4 10/06/2021 0950   HGB 14.2 05/19/2011 0736   HCT 39.6 10/06/2021 0950   HCT 42.0 05/19/2011 0736   PLT 232 10/06/2021 0950   PLT 219 05/19/2011 0736   MCV 90.6 10/06/2021 0950   MCV 93 05/19/2011 0736   MCH 30.7 10/06/2021 0950   MCHC 33.8 10/06/2021 0950  RDW 12.4 10/06/2021 0950   RDW 13.4 05/19/2011 0736   LYMPHSABS 0.5 (L) 10/06/2021 0950   MONOABS 0.4 10/06/2021 0950   EOSABS 0.0 10/06/2021 0950   BASOSABS 0.0 10/06/2021 0950       Latest Ref Rng & Units 10/14/2021   12:10 PM 10/06/2021    9:50 AM 11/12/2015   11:50 AM  CMP  Glucose 70 - 99 mg/dL  166  116   BUN 8 - 23 mg/dL  8  10   Creatinine 0.61 - 1.24 mg/dL  0.95  1.17   Sodium 135 - 145 mmol/L  136  138   Potassium 3.5 - 5.1 mmol/L  3.3  4.1   Chloride 98 - 111 mmol/L  100  101   CO2 22 - 32 mmol/L  29  30   Calcium 8.9 - 10.3 mg/dL  8.6  9.2   Total Protein 6.5 - 8.1 g/dL 7.6  7.2    Total Bilirubin 0.3 - 1.2 mg/dL 0.6  1.0    Alkaline Phos 38 - 126 U/L 172  197    AST 15 - 41 U/L 34  50    ALT 0 - 44 U/L 48  76          ? Impression/Recommendation  Primary syphilis-  chancre- got Benzathine penicillin 2.4 million units on 8/17  Herpes genitalis  with HSV 1 Took  Valtrex 1 gram Q 12 X 7 days Pt requested that his notes not be shared  Hiv antibody neg  HIV RNA neg ? _Transaminitis- will check hepatitis panel today Follow up 3 months for  labs __________________________________________________  Note:  This document was prepared using Dragon voice recognition software and may include unintentional dictation errors.

## 2021-10-28 NOTE — Patient Instructions (Addendum)
You are here for follow up- you have been treated for herpes and the lesion has healed. Will follow in 3 months for blood work and lymphadenopathy

## 2021-10-29 ENCOUNTER — Telehealth: Payer: Self-pay

## 2021-10-29 NOTE — Telephone Encounter (Signed)
Called patient to relay results, no answer. Left HIPAA compliant voicemail requesting callback.   Christal Lagerstrom D Yolanda Dockendorf, RN  

## 2021-10-29 NOTE — Telephone Encounter (Signed)
Spoke with Maurice Lucas, relayed that his hepatitis panel was negative. Reminded him of follow up in December. Patient verbalized understanding and has no further questions.   Beryle Flock, RN

## 2021-10-29 NOTE — Telephone Encounter (Signed)
-----   Message from Tsosie Billing, MD sent at 10/29/2021 10:01 AM EDT ----- Regarding: FW: Can you please let him know that the viral hepatitis screen we did yesterday is fine .. as per plan follow up in 3 months. Thx ----- Message ----- From: Buel Ream, Lab In [place] Sent: 10/28/2021   2:27 PM EDT To: Tsosie Billing, MD

## 2022-01-12 ENCOUNTER — Telehealth: Payer: Self-pay

## 2022-01-12 NOTE — Telephone Encounter (Signed)
Patient called stating that he had a CMP done on 10/9 at his PCP office. Patient wanted inform Dr.Ravishankar to see if he still needed to do lab work. Patient has appointment

## 2022-01-27 ENCOUNTER — Ambulatory Visit: Payer: BC Managed Care – PPO | Admitting: Infectious Diseases

## 2022-06-12 ENCOUNTER — Encounter: Payer: Self-pay | Admitting: *Deleted

## 2022-06-15 ENCOUNTER — Encounter
Admission: RE | Disposition: A | Discharge status: Home / Self Care | Payer: Self-pay | Source: Home / Self Care | Attending: Gastroenterology

## 2022-06-15 ENCOUNTER — Ambulatory Visit: Payer: Managed Care, Other (non HMO) | Admitting: Certified Registered"

## 2022-06-15 ENCOUNTER — Ambulatory Visit
Admission: RE | Admit: 2022-06-15 | Discharge: 2022-06-15 | Disposition: A | Discharge status: Home / Self Care | Payer: Managed Care, Other (non HMO) | Attending: Gastroenterology | Admitting: Gastroenterology

## 2022-06-15 ENCOUNTER — Encounter: Payer: Self-pay | Admitting: *Deleted

## 2022-06-15 DIAGNOSIS — K449 Diaphragmatic hernia without obstruction or gangrene: Secondary | ICD-10-CM | POA: Insufficient documentation

## 2022-06-15 DIAGNOSIS — Z87891 Personal history of nicotine dependence: Secondary | ICD-10-CM | POA: Insufficient documentation

## 2022-06-15 DIAGNOSIS — D120 Benign neoplasm of cecum: Secondary | ICD-10-CM | POA: Insufficient documentation

## 2022-06-15 DIAGNOSIS — K222 Esophageal obstruction: Secondary | ICD-10-CM | POA: Insufficient documentation

## 2022-06-15 DIAGNOSIS — K219 Gastro-esophageal reflux disease without esophagitis: Secondary | ICD-10-CM | POA: Diagnosis not present

## 2022-06-15 DIAGNOSIS — K317 Polyp of stomach and duodenum: Secondary | ICD-10-CM | POA: Diagnosis not present

## 2022-06-15 DIAGNOSIS — Z1211 Encounter for screening for malignant neoplasm of colon: Secondary | ICD-10-CM | POA: Insufficient documentation

## 2022-06-15 DIAGNOSIS — K640 First degree hemorrhoids: Secondary | ICD-10-CM | POA: Insufficient documentation

## 2022-06-15 DIAGNOSIS — D123 Benign neoplasm of transverse colon: Secondary | ICD-10-CM | POA: Diagnosis not present

## 2022-06-15 DIAGNOSIS — E785 Hyperlipidemia, unspecified: Secondary | ICD-10-CM | POA: Insufficient documentation

## 2022-06-15 HISTORY — PX: ESOPHAGOGASTRODUODENOSCOPY (EGD) WITH PROPOFOL: SHX5813

## 2022-06-15 HISTORY — PX: COLONOSCOPY WITH PROPOFOL: SHX5780

## 2022-06-15 SURGERY — ESOPHAGOGASTRODUODENOSCOPY (EGD) WITH PROPOFOL
Anesthesia: General

## 2022-06-15 MED ORDER — PROPOFOL 10 MG/ML IV BOLUS
INTRAVENOUS | Status: AC
  Filled 2022-06-15: qty 20

## 2022-06-15 MED ORDER — EPHEDRINE 5 MG/ML INJ
INTRAVENOUS | Status: AC
  Filled 2022-06-15: qty 5

## 2022-06-15 MED ORDER — SODIUM CHLORIDE 0.9 % IV SOLN: INTRAVENOUS | Status: DC

## 2022-06-15 MED ORDER — GLYCOPYRROLATE 0.2 MG/ML IJ SOLN
INTRAMUSCULAR | Status: AC
  Filled 2022-06-15: qty 1

## 2022-06-15 MED ORDER — PROPOFOL 10 MG/ML IV BOLUS
INTRAVENOUS | Status: AC
  Filled 2022-06-15: qty 40

## 2022-06-15 MED ORDER — GLYCOPYRROLATE 0.2 MG/ML IJ SOLN
INTRAMUSCULAR | Status: DC | PRN
  Administered 2022-06-15: .2 mg via INTRAVENOUS

## 2022-06-15 MED ORDER — LIDOCAINE HCL (CARDIAC) PF 100 MG/5ML IV SOSY
PREFILLED_SYRINGE | INTRAVENOUS | Status: DC | PRN
  Administered 2022-06-15: 100 mg via INTRAVENOUS

## 2022-06-15 MED ORDER — PROPOFOL 10 MG/ML IV BOLUS
INTRAVENOUS | Status: DC | PRN
  Administered 2022-06-15: 100 mg via INTRAVENOUS
  Administered 2022-06-15: 20 mg via INTRAVENOUS
  Administered 2022-06-15: 120 ug/kg/min via INTRAVENOUS
  Administered 2022-06-15 (×3): 20 mg via INTRAVENOUS

## 2022-06-15 NOTE — Anesthesia Postprocedure Evaluation (Signed)
Anesthesia Post Note  Patient: TYRE BEAVER  Procedure(s) Performed: ESOPHAGOGASTRODUODENOSCOPY (EGD) WITH PROPOFOL COLONOSCOPY WITH PROPOFOL  Patient location during evaluation: Endoscopy Anesthesia Type: General Level of consciousness: awake and alert Pain management: pain level controlled Vital Signs Assessment: post-procedure vital signs reviewed and stable Respiratory status: spontaneous breathing, nonlabored ventilation, respiratory function stable and patient connected to nasal cannula oxygen Cardiovascular status: blood pressure returned to baseline and stable Postop Assessment: no apparent nausea or vomiting Anesthetic complications: no  No notable events documented.   Last Vitals:  Vitals:   06/15/22 1106 06/15/22 1113  BP: 110/78 117/80  Pulse: (!) 55 (!) 56  Resp: 15 13  Temp:    SpO2: 99% 100%    Last Pain:  Vitals:   06/15/22 1113  TempSrc:   PainSc: 0-No pain                 Stephanie Coup

## 2022-06-15 NOTE — Interval H&P Note (Signed)
History and Physical Interval Note:  06/15/2022 9:57 AM  Maurice Lucas  has presented today for surgery, with the diagnosis of BARRETT'S ESOPHAGUS,HX OF ADENOMATOUS POLYP OF COLON.  The various methods of treatment have been discussed with the patient and family. After consideration of risks, benefits and other options for treatment, the patient has consented to  Procedure(s): ESOPHAGOGASTRODUODENOSCOPY (EGD) WITH PROPOFOL (N/A) COLONOSCOPY WITH PROPOFOL (N/A) as a surgical intervention.  The patient's history has been reviewed, patient examined, no change in status, stable for surgery.  I have reviewed the patient's chart and labs.  Questions were answered to the patient's satisfaction.     Regis Bill  Ok to proceed with EGD/Colonoscopy

## 2022-06-15 NOTE — Anesthesia Preprocedure Evaluation (Signed)
Anesthesia Evaluation  Patient identified by MRN, date of birth, ID band Patient awake    Reviewed: Allergy & Precautions, NPO status , Patient's Chart, lab work & pertinent test results  History of Anesthesia Complications Negative for: history of anesthetic complications  Airway Mallampati: II       Dental   Pulmonary neg pulmonary ROS, neg shortness of breath, neg COPD, former smoker          Cardiovascular (-) angina negative cardio ROS      Neuro/Psych negative neurological ROS     GI/Hepatic Neg liver ROS, hiatal hernia,GERD  Medicated,,  Endo/Other  negative endocrine ROS    Renal/GU Renal disease (stones)     Musculoskeletal   Abdominal   Peds  Hematology negative hematology ROS (+)   Anesthesia Other Findings   Reproductive/Obstetrics                             Anesthesia Physical Anesthesia Plan  ASA: 2  Anesthesia Plan: General   Post-op Pain Management:    Induction: Intravenous  PONV Risk Score and Plan:   Airway Management Planned: Natural Airway and Nasal Cannula  Additional Equipment:   Intra-op Plan:   Post-operative Plan:   Informed Consent: I have reviewed the patients History and Physical, chart, labs and discussed the procedure including the risks, benefits and alternatives for the proposed anesthesia with the patient or authorized representative who has indicated his/her understanding and acceptance.     Dental Advisory Given  Plan Discussed with: Anesthesiologist, CRNA and Surgeon  Anesthesia Plan Comments: (Patient consented for risks of anesthesia including but not limited to:  - adverse reactions to medications - risk of airway placement if required - damage to eyes, teeth, lips or other oral mucosa - nerve damage due to positioning  - sore throat or hoarseness - Damage to heart, brain, nerves, lungs, other parts of body or loss of  life  Patient voiced understanding.)        Anesthesia Quick Evaluation

## 2022-06-15 NOTE — H&P (Signed)
Outpatient short stay form Pre-procedure 06/15/2022  Maurice Bill, MD  Primary Physician: Marina Goodell, MD  Reason for visit:  Barrett's esophagus/Personal history of adenomatous polyp  History of present illness:    72 y/o gentleman here for EGD/Colonoscopy with history of GERD and HLD here to assess for possible BE and also to do colonoscopy for adenomatous polyp on last colonoscopy in 2018. No blood thinners. No family history of GI malignancies. No significant abdominal surgeries.    Current Facility-Administered Medications:    0.9 %  sodium chloride infusion, , Intravenous, Continuous, Keshaun Dubey, Rossie Muskrat, MD, Last Rate: 20 mL/hr at 06/15/22 0936, New Bag at 06/15/22 0936  Medications Prior to Admission  Medication Sig Dispense Refill Last Dose   pantoprazole (PROTONIX) 40 MG tablet Take 40 mg by mouth every morning.   06/14/2022   simvastatin (ZOCOR) 20 MG tablet Take 20 mg by mouth at bedtime.   Past Week   dexamethasone (DECADRON) 4 MG tablet Take 6 tablets on Day 1 with food, then decrease by 1 tablet daily until finished (6,5,4,3,2,1) (Patient not taking: Reported on 06/15/2022) 21 tablet 0 Completed Course   fexofenadine-pseudoephedrine (ALLEGRA-D ALLERGY & CONGESTION) 180-240 MG 24 hr tablet Take 1 tablet by mouth daily. 30 tablet 0    Omega-3 Fatty Acids (FISH OIL CONCENTRATE PO) Take 1 capsule by mouth daily.      valACYclovir (VALTREX) 1000 MG tablet Take 1 tablet (1,000 mg total) by mouth 2 (two) times daily. (Patient not taking: Reported on 10/28/2021) 20 tablet 0      No Known Allergies   Past Medical History:  Diagnosis Date   Barrett esophagus    Chronic kidney disease    kidney stones   GERD (gastroesophageal reflux disease)    Hepatic hemangioma    History of hiatal hernia    Hyperlipidemia    Kidney stone     Review of systems:  Otherwise negative.    Physical Exam  Gen: Alert, oriented. Appears stated age.  HEENT: PERRLA. Lungs: No  respiratory distress CV: RRR Abd: soft, benign, no masses Ext: No edema    Planned procedures: Proceed with EGD/colonoscopy. The patient understands the nature of the planned procedure, indications, risks, alternatives and potential complications including but not limited to bleeding, infection, perforation, damage to internal organs and possible oversedation/side effects from anesthesia. The patient agrees and gives consent to proceed.  Please refer to procedure notes for findings, recommendations and patient disposition/instructions.     Maurice Bill, MD [facility]

## 2022-06-15 NOTE — Transfer of Care (Signed)
Immediate Anesthesia Transfer of Care Note  Patient: Maurice Lucas  Procedure(s) Performed: ESOPHAGOGASTRODUODENOSCOPY (EGD) WITH PROPOFOL COLONOSCOPY WITH PROPOFOL  Patient Location: Endoscopy Unit  Anesthesia Type:General  Level of Consciousness: awake  Airway & Oxygen Therapy: Patient Spontanous Breathing  Post-op Assessment: Report given to RN and Post -op Vital signs reviewed and stable  Post vital signs: Reviewed and stable  Last Vitals:  Vitals Value Taken Time  BP 103/65 06/15/22 1046  Temp 36.4 C 06/15/22 1046  Pulse 61 06/15/22 1048  Resp 17 06/15/22 1048  SpO2 97 % 06/15/22 1048  Vitals shown include unvalidated device data.  Last Pain:  Vitals:   06/15/22 1046  TempSrc: Temporal  PainSc: Asleep         Complications: No notable events documented.

## 2022-06-15 NOTE — Op Note (Addendum)
[facility] Patient Name: Maurice Lucas Procedure Date: 06/15/2022 9:49 AM MRN: [id number] Account #: [id number] Date of Birth: 21-Jan-1951 Admit Type: Outpatient Age: 72 Room: [facility] ENDO ROOM 3 Gender: Male Note Status: Finalized Instrument Name: Nelda Marseille [id number] Procedure:             Colonoscopy Indications:           Surveillance: Personal history of adenomatous polyps                         on last colonoscopy > 5 years ago Providers:             Eather Colas MD, MD Medicines:             Monitored Anesthesia Care Complications:         No immediate complications. Estimated blood loss:                         Minimal. Procedure:             Pre-Anesthesia Assessment:                        - Prior to the procedure, a History and Physical was                         performed, and patient medications and allergies were                         reviewed. The patient is competent. The risks and                         benefits of the procedure and the sedation options and                         risks were discussed with the patient. All questions                         were answered and informed consent was obtained.                         Patient identification and proposed procedure were                         verified by the physician, the nurse, the                         anesthesiologist, the anesthetist and the technician                         in the endoscopy suite. Mental Status Examination:                         alert and oriented. Airway Examination: normal                         oropharyngeal airway and neck mobility. Respiratory                         Examination: clear to auscultation. CV Examination:  normal. Prophylactic Antibiotics: The patient does not                         require prophylactic antibiotics. Prior                         Anticoagulants: The patient has taken no anticoagulant                          or antiplatelet agents. ASA Grade Assessment: II - A                         patient with mild systemic disease. After reviewing                         the risks and benefits, the patient was deemed in                         satisfactory condition to undergo the procedure. The                         anesthesia plan was to use monitored anesthesia care                         (MAC). Immediately prior to administration of                         medications, the patient was re-assessed for adequacy                         to receive sedatives. The heart rate, respiratory                         rate, oxygen saturations, blood pressure, adequacy of                         pulmonary ventilation, and response to care were                         monitored throughout the procedure. The physical                         status of the patient was re-assessed after the                         procedure.                        After obtaining informed consent, the colonoscope was                         passed under direct vision. Throughout the procedure,                         the patient's blood pressure, pulse, and oxygen                         saturations were monitored continuously. The  Colonoscope was introduced through the anus and                         advanced to the the cecum, identified by appendiceal                         orifice and ileocecal valve. The colonoscopy was                         performed without difficulty. The patient tolerated                         the procedure well. The quality of the bowel                         preparation was good. The ileocecal valve, appendiceal                         orifice, and rectum were photographed. Findings:      The perianal and digital rectal examinations were normal.      A 15 mm polyp was found in the cecum. The polyp was Paris classification       Is (protruding, sessile).  Preparations were made for mucosal resection.       Demarcation of the lesion was performed with narrow band imaging to       clearly identify the boundaries of the lesion. Eleview was injected to       raise the lesion. Snare mucosal resection was performed. Resection and       retrieval were complete. Resected tissue margins were examined and clear       of polyp tissue. Edges of polypectomy site were burned with soft-tip       coag. To prevent bleeding after the polypectomy, two hemostatic clips       were successfully placed. There was no bleeding during, or at the end,       of the procedure.      A 4 mm polyp was found in the cecum. The polyp was sessile. The polyp       was removed with a cold snare. Resection and retrieval were complete.       Estimated blood loss was minimal.      Two sessile polyps were found in the hepatic flexure. The polyps were 3       to 4 mm in size. These polyps were removed with a cold snare. Resection       and retrieval were complete. Estimated blood loss was minimal.      Internal hemorrhoids were found during retroflexion. The hemorrhoids       were Grade I (internal hemorrhoids that do not prolapse).      The exam was otherwise without abnormality on direct and retroflexion       views. Impression:            - One 15 mm polyp in the cecum, removed with mucosal                         resection. Resected and retrieved. Clips were placed.                        - One 4 mm polyp  in the cecum, removed with a cold                         snare. Resected and retrieved.                        - Two 3 to 4 mm polyps at the hepatic flexure, removed                         with a cold snare. Resected and retrieved.                        - Internal hemorrhoids.                        - The examination was otherwise normal on direct and                         retroflexion views.                        - Mucosal resection was performed. Resection and                          retrieval were complete. Recommendation:        - Discharge patient to home.                        - Resume previous diet.                        - Continue present medications.                        - Await pathology results.                        - Repeat colonoscopy in 3 years for surveillance.                        - Return to referring physician as previously                         scheduled. Procedure Code(s):     --- Professional ---                        [id number], Colonoscopy, flexible; with endoscopic mucosal                         resection                        45385, 59, Colonoscopy, flexible; with removal of                         tumor(s), polyp(s), or other lesion(s) by snare                         technique Diagnosis Code(s):     --- Professional ---  Z86.010, Personal history of colonic polyps                        D12.0, Benign neoplasm of cecum                        D12.3, Benign neoplasm of transverse colon (hepatic                         flexure or splenic flexure)                        K64.0, First degree hemorrhoids CPT copyright 2022 American [employer]. All rights reserved. The codes documented in this report are preliminary and upon coder review may  be revised to meet current compliance requirements. Eather Colas MD, MD 06/15/2022 10:51:54 AM Number of Addenda: 0 Note Initiated On: 06/15/2022 9:49 AM Scope Withdrawal Time: 0 hours 20 minutes 48 seconds  Total Procedure Duration: 0 hours 25 minutes 39 seconds  Estimated Blood Loss:  Estimated blood loss was minimal.      [facility]

## 2022-06-15 NOTE — Op Note (Addendum)
[facility] Patient Name: Maurice Lucas Procedure Date: 06/15/2022 9:48 AM MRN: [id number] Account #: [id number] Date of Birth: Feb 15, 1951 Admit Type: Outpatient Age: 72 Room: [facility] ENDO ROOM 3 Gender: Male Note Status: Finalized Instrument Name: Upper Endoscope [id number] Procedure:             Upper GI endoscopy Indications:           Gastro-esophageal reflux disease Providers:             Eather Colas MD, MD Medicines:             Monitored Anesthesia Care Complications:         No immediate complications. Estimated blood loss:                         Minimal. Procedure:             Pre-Anesthesia Assessment:                        - Prior to the procedure, a History and Physical was                         performed, and patient medications and allergies were                         reviewed. The patient is competent. The risks and                         benefits of the procedure and the sedation options and                         risks were discussed with the patient. All questions                         were answered and informed consent was obtained.                         Patient identification and proposed procedure were                         verified by the physician, the nurse, the                         anesthesiologist, the anesthetist and the technician                         in the endoscopy suite. Mental Status Examination:                         alert and oriented. Airway Examination: normal                         oropharyngeal airway and neck mobility. Respiratory                         Examination: clear to auscultation. CV Examination:                         normal. Prophylactic Antibiotics: The patient does not  require prophylactic antibiotics. Prior                         Anticoagulants: The patient has taken no anticoagulant                         or antiplatelet agents. ASA Grade  Assessment: II - A                         patient with mild systemic disease. After reviewing                         the risks and benefits, the patient was deemed in                         satisfactory condition to undergo the procedure. The                         anesthesia plan was to use monitored anesthesia care                         (MAC). Immediately prior to administration of                         medications, the patient was re-assessed for adequacy                         to receive sedatives. The heart rate, respiratory                         rate, oxygen saturations, blood pressure, adequacy of                         pulmonary ventilation, and response to care were                         monitored throughout the procedure. The physical                         status of the patient was re-assessed after the                         procedure.                        After obtaining informed consent, the endoscope was                         passed under direct vision. Throughout the procedure,                         the patient's blood pressure, pulse, and oxygen                         saturations were monitored continuously. The Endoscope                         was introduced through the mouth, and advanced to the  second part of duodenum. The upper GI endoscopy was                         accomplished without difficulty. The patient tolerated                         the procedure well. Findings:      A obstructing Schatzki ring was found in the lower third of the       esophagus. The ring was gently dilated with the scope. No further       dilation performed as patient did not complain of dysphagia.      The esophagus and gastroesophageal junction were examined with white       light. There was no visual evidence of Barrett's esophagus.      Multiple small sessile fundic gland polyps with no bleeding and no       stigmata of recent bleeding  were found in the gastric fundus and in the       gastric body. One polyp was removed with a cold snare. Resection and       retrieval were complete. Estimated blood loss was minimal.      The exam of the stomach was otherwise normal.      The examined duodenum was normal. Impression:            - Obstructing Schatzki ring.                        - There is no endoscopic evidence of Barrett's                         esophagus.                        - Multiple fundic gland polyps. Resected and retrieved.                        - Normal examined duodenum. Recommendation:        - Discharge patient to home.                        - Resume previous diet.                        - Continue present medications.                        - Await pathology results.                        - Return to referring physician as previously                         scheduled. Can repeat EGD if any complaint of                         dysphagia. Procedure Code(s):     --- Professional ---                        [phone], Esophagogastroduodenoscopy, flexible,  transoral; with removal of tumor(s), polyp(s), or                         other lesion(s) by snare technique Diagnosis Code(s):     --- Professional ---                        K22.2, Esophageal obstruction                        K31.7, Polyp of stomach and duodenum                        K21.9, Gastro-esophageal reflux disease without                         esophagitis CPT copyright 2022 American Medical Association. All rights reserved. The codes documented in this report are preliminary and upon coder review may  be revised to meet current compliance requirements. Eather Colas MD, MD 06/15/2022 10:46:30 AM Number of Addenda: 0 Note Initiated On: 06/15/2022 9:48 AM Estimated Blood Loss:  Estimated blood loss was minimal.      [facility]

## 2022-06-16 LAB — SURGICAL PATHOLOGY

## 2022-06-17 ENCOUNTER — Encounter: Payer: Self-pay | Admitting: Gastroenterology

## 2022-12-29 DIAGNOSIS — B009 Herpesviral infection, unspecified: Secondary | ICD-10-CM | POA: Diagnosis not present

## 2022-12-29 DIAGNOSIS — E7800 Pure hypercholesterolemia, unspecified: Secondary | ICD-10-CM | POA: Diagnosis not present

## 2022-12-29 DIAGNOSIS — Z0000 Encounter for general adult medical examination without abnormal findings: Secondary | ICD-10-CM | POA: Diagnosis not present

## 2022-12-29 DIAGNOSIS — R7302 Impaired glucose tolerance (oral): Secondary | ICD-10-CM | POA: Diagnosis not present

## 2022-12-29 DIAGNOSIS — K219 Gastro-esophageal reflux disease without esophagitis: Secondary | ICD-10-CM | POA: Diagnosis not present

## 2024-01-18 ENCOUNTER — Emergency Department
Admission: EM | Admit: 2024-01-18 | Discharge: 2024-01-18 | Disposition: A | Discharge status: Home / Self Care | Attending: Emergency Medicine | Admitting: Emergency Medicine

## 2024-01-18 ENCOUNTER — Emergency Department

## 2024-01-18 ENCOUNTER — Encounter: Payer: Self-pay | Admitting: *Deleted

## 2024-01-18 ENCOUNTER — Other Ambulatory Visit: Payer: Self-pay

## 2024-01-18 DIAGNOSIS — R079 Chest pain, unspecified: Secondary | ICD-10-CM

## 2024-01-18 DIAGNOSIS — R0602 Shortness of breath: Secondary | ICD-10-CM | POA: Diagnosis present

## 2024-01-18 DIAGNOSIS — N189 Chronic kidney disease, unspecified: Secondary | ICD-10-CM | POA: Diagnosis not present

## 2024-01-18 DIAGNOSIS — K2270 Barrett's esophagus without dysplasia: Secondary | ICD-10-CM | POA: Insufficient documentation

## 2024-01-18 DIAGNOSIS — R0789 Other chest pain: Secondary | ICD-10-CM | POA: Diagnosis present

## 2024-01-18 DIAGNOSIS — K219 Gastro-esophageal reflux disease without esophagitis: Secondary | ICD-10-CM | POA: Insufficient documentation

## 2024-01-18 DIAGNOSIS — E785 Hyperlipidemia, unspecified: Secondary | ICD-10-CM | POA: Insufficient documentation

## 2024-01-18 LAB — CBC
HCT: 40.1 % (ref 39.0–52.0)
Hemoglobin: 13.8 g/dL (ref 13.0–17.0)
MCH: 32.2 pg (ref 26.0–34.0)
MCHC: 34.4 g/dL (ref 30.0–36.0)
MCV: 93.5 fL (ref 80.0–100.0)
Platelets: 288 K/uL (ref 150–400)
RBC: 4.29 MIL/uL (ref 4.22–5.81)
RDW: 13.2 % (ref 11.5–15.5)
WBC: 5.6 K/uL (ref 4.0–10.5)
nRBC: 0 % (ref 0.0–0.2)

## 2024-01-18 LAB — TROPONIN T, HIGH SENSITIVITY
Troponin T High Sensitivity: 16 ng/L (ref 0–19)
Troponin T High Sensitivity: <15 ng/L (ref 0–19)

## 2024-01-18 LAB — BASIC METABOLIC PANEL WITH GFR
Anion gap: 9 (ref 5–15)
BUN: 9 mg/dL (ref 8–23)
CO2: 29 mmol/L (ref 22–32)
Calcium: 8.8 mg/dL — ABNORMAL LOW (ref 8.9–10.3)
Chloride: 101 mmol/L (ref 98–111)
Creatinine, Ser: 1.15 mg/dL (ref 0.61–1.24)
GFR, Estimated: >60 mL/min (ref >60)
Glucose, Bld: 127 mg/dL — ABNORMAL HIGH (ref 70–99)
Potassium: 4.1 mmol/L (ref 3.5–5.1)
Sodium: 138 mmol/L (ref 135–145)

## 2024-01-18 LAB — PRO BRAIN NATRIURETIC PEPTIDE: Pro Brain Natriuretic Peptide: <50 pg/mL (ref <300.0)

## 2024-01-18 LAB — D-DIMER, QUANTITATIVE: D-Dimer, Quant: 0.3 ug{FEU}/mL (ref 0.00–0.50)

## 2024-01-18 MED ORDER — LIDOCAINE VISCOUS HCL 2 % MT SOLN
15.0000 mL | Freq: Once | OROMUCOSAL | Status: AC
  Administered 2024-01-18: 15 mL via ORAL
  Filled 2024-01-18: qty 15

## 2024-01-18 MED ORDER — ALUM & MAG HYDROXIDE-SIMETH 200-200-20 MG/5ML PO SUSP
30.0000 mL | Freq: Once | ORAL | Status: AC
  Administered 2024-01-18: 30 mL via ORAL
  Filled 2024-01-18: qty 30

## 2024-01-18 MED ORDER — SUCRALFATE 1 G PO TABS: 1.0000 g | ORAL_TABLET | Freq: Three times a day (TID) | ORAL | 0 refills | Status: AC | PRN

## 2024-01-18 MED ORDER — PANTOPRAZOLE SODIUM 40 MG PO TBEC
40.0000 mg | DELAYED_RELEASE_TABLET | Freq: Once | ORAL | Status: AC
  Administered 2024-01-18: 40 mg via ORAL
  Filled 2024-01-18: qty 1

## 2024-01-18 NOTE — ED Triage Notes (Signed)
 Pt ambulatory to triage.  Pt has chest pain for 2 hours.  Pt also has sob.  Pt taking antacids without relief.   Pt has pain in center of chest   pt alert  speech clear.

## 2024-01-18 NOTE — ED Provider Notes (Signed)
 [facility] Provider Note    Event Date/Time   First MD Initiated Contact with Patient 01/18/24 1846     (approximate)   History   Chest Pain   HPI  Maurice Lucas is a 73 y.o. male with history of Barrett's esophagus, GERD, hyperlipidemia, CKD, presenting with chest pressure.  States he thought it could be acid reflux, took some antacids.  States he took them 4-5 hours ago, unsure if they helped.  States that the chest pain is improving at this time.  Did feel some slight shortness of breath.  Denies history of CHF, no history of malignancies or DVT, no recent travel or surgeries.  No leg swelling, no history of CHF or heart attacks in the past.  Denies smoking history.    Per independent chart review, he was seen by primary care in June, has history of hyperlipidemia, GERD, was noted that his symptoms were stable.  Physical Exam   Triage Vital Signs: ED Triage Vitals  Encounter Vitals Group     BP 01/18/24 1753 133/87     Girls Systolic BP Percentile --      Girls Diastolic BP Percentile --      Boys Systolic BP Percentile --      Boys Diastolic BP Percentile --      Pulse Rate 01/18/24 1753 72     Resp 01/18/24 1753 18     Temp 01/18/24 1753 97.9 F (36.6 C)     Temp Source 01/18/24 1753 Oral     SpO2 01/18/24 1753 99 %     Weight 01/18/24 1751 198 lb (89.8 kg)     Height 01/18/24 1751 5' 6 (1.676 m)     Head Circumference --      Peak Flow --      Pain Score 01/18/24 1751 6     Pain Loc --      Pain Education --      Exclude from Growth Chart --     Most recent vital signs: Vitals:   01/18/24 1753 01/18/24 2130  BP: 133/87 123/77  Pulse: 72 (!) 58  Resp: 18 (!) 21  Temp: 97.9 F (36.6 C)   SpO2: 99%      General: Awake, no distress.  CV:  Good peripheral perfusion.  Resp:  Normal effort.  No tachypnea or respiratory distress Abd:  No distention.  Soft nontender Other:  No unilateral calf sign or tenderness   ED Results /  Procedures / Treatments   Labs (all labs ordered are listed, but only abnormal results are displayed) Labs Reviewed  BASIC METABOLIC PANEL WITH GFR - Abnormal; Notable for the following components:      Result Value   Glucose, Bld 127 (*)    Calcium 8.8 (*)    All other components within normal limits  CBC  D-DIMER, QUANTITATIVE  PRO BRAIN NATRIURETIC PEPTIDE  TROPONIN T, HIGH SENSITIVITY  TROPONIN T, HIGH SENSITIVITY     EKG  EKG shows, sinus rhythm, normal QRS, normal QTc, no obvious ischemic ST elevation, T wave flattening in aVL, T wave changes new compared to prior   RADIOLOGY On my independent interpretation, x-ray without obvious consolidation   PROCEDURES:  Critical Care performed: No  Procedures   MEDICATIONS ORDERED IN ED: Medications  pantoprazole  (PROTONIX ) EC tablet 40 mg (40 mg Oral Given 01/18/24 2012)  alum & mag hydroxide-simeth (MAALOX/MYLANTA) 200-200-20 MG/5ML suspension 30 mL (30 mLs Oral Given 01/18/24 2012)  And  lidocaine  (XYLOCAINE ) 2 % viscous mouth solution 15 mL (15 mLs Oral Given 01/18/24 2012)     IMPRESSION / MDM / ASSESSMENT AND PLAN / ED COURSE  I reviewed the triage vital signs and the nursing notes.                              Differential diagnosis includes, but is not limited to, angina, ACS, GERD, did consider CHF but patient does not appear fluid overloaded at this time, feels consider PE but he has no other risk factors for it, not tachycardic or hypoxic.  Will get labs, EKG, troponin, chest x-ray, D-dimer, GI cocktail.  Reassess.  Patient's presentation is most consistent with acute presentation with potential threat to life or bodily function.  Independent interpretation of labs and imaging below.  On reassessment patient is feeling a lot better, discussed with him and family about imaging and lab results, patient is already taking Protonix  40 mg, will give him a prescription for super fate that he can take as needed for  GERD, discussed with him about avoiding fried, spicy, acidic foods for at least a week and to schedule closer follow-up with GI.  Also put in a referral for cardiology since he has not seen one in the past.  Considered but no indication for inpatient admission at this time, he safe for outpatient management.  Will discharge with strict return precautions.  Shared decision making done with patient and family he is agreeable with this plan.    Clinical Course as of 01/18/24 2133  Tue Jan 18, 2024  2043 DG Chest 2 View 1. No acute intrathoracic process.  [TT]  2043 Called to clarify about troponin since it still says collected, lab will run a troponin now and run a troponin from samples collected and 554. [TT]  2122 Reviewed labs, electrolytes not severely deranged, no leukocytosis, troponin is not elevated, D-dimer is not elevated. [TT]  2126 Pro Brain Natriuretic Peptide: <50.0 Not elevated [TT]  2126 Troponin T High Sensitivity: <15 Not elevated [TT]    Clinical Course User Index [TT] Waymond Lorelle Cummins, MD     FINAL CLINICAL IMPRESSION(S) / ED DIAGNOSES   Final diagnoses:  Chest pain, unspecified type  Gastroesophageal reflux disease, unspecified whether esophagitis present     Rx / DC Orders   ED Discharge Orders          Ordered    Ambulatory referral to Cardiology       Comments: If you have not heard from the Cardiology office within the next 72 hours please call [phone].   01/18/24 2130    sucralfate  (CARAFATE ) 1 g tablet  3 times daily PRN        01/18/24 2131             Note:  This document was prepared using Dragon voice recognition software and may include unintentional dictation errors.    Waymond Lorelle Cummins, MD 01/18/24 2133

## 2024-01-18 NOTE — Discharge Instructions (Signed)
 You can take the sucralfate  as needed for your acid reflux, please avoid any spicy, acidic, fried foods for at least a week.  Please take the Protonix  as prescribed by your GI doctor.  Please be sure to call your GI doctor's office tomorrow or this week to schedule follow-up appointment, I have also put in a referral for you for cardiology.
